# Patient Record
Sex: Female | Born: 1965 | Race: White | Hispanic: No | Smoking: Never smoker
Health system: Southern US, Community
[De-identification: ages and names within clinical notes are randomized; demographics above are authoritative.]

## PROBLEM LIST (undated history)

## (undated) DIAGNOSIS — M1990 Unspecified osteoarthritis, unspecified site: Secondary | ICD-10-CM

## (undated) DIAGNOSIS — R519 Headache, unspecified: Secondary | ICD-10-CM

## (undated) DIAGNOSIS — I639 Cerebral infarction, unspecified: Secondary | ICD-10-CM

## (undated) DIAGNOSIS — R51 Headache: Secondary | ICD-10-CM

## (undated) DIAGNOSIS — M797 Fibromyalgia: Secondary | ICD-10-CM

## (undated) DIAGNOSIS — I10 Essential (primary) hypertension: Secondary | ICD-10-CM

## (undated) DIAGNOSIS — R609 Edema, unspecified: Secondary | ICD-10-CM

## (undated) DIAGNOSIS — E785 Hyperlipidemia, unspecified: Secondary | ICD-10-CM

## (undated) HISTORY — DX: Edema, unspecified: R60.9

## (undated) HISTORY — DX: Headache: R51

## (undated) HISTORY — DX: Hyperlipidemia, unspecified: E78.5

## (undated) HISTORY — DX: Headache, unspecified: R51.9

## (undated) HISTORY — PX: ABDOMINAL HYSTERECTOMY: SHX81

## (undated) HISTORY — DX: Fibromyalgia: M79.7

## (undated) HISTORY — DX: Cerebral infarction, unspecified: I63.9

## (undated) HISTORY — DX: Essential (primary) hypertension: I10

---

## 2009-04-25 ENCOUNTER — Ambulatory Visit (HOSPITAL_COMMUNITY): Admission: RE | Admit: 2009-04-25 | Discharge: 2009-04-25 | Payer: Self-pay

## 2016-05-30 ENCOUNTER — Ambulatory Visit (INDEPENDENT_AMBULATORY_CARE_PROVIDER_SITE_OTHER): Payer: BLUE CROSS/BLUE SHIELD | Admitting: Neurology

## 2016-05-30 ENCOUNTER — Encounter: Payer: Self-pay | Admitting: Neurology

## 2016-05-30 VITALS — BP 112/70 | HR 76 | Resp 16 | Ht 63.0 in | Wt 200.0 lb

## 2016-05-30 DIAGNOSIS — R0683 Snoring: Secondary | ICD-10-CM

## 2016-05-30 DIAGNOSIS — G2581 Restless legs syndrome: Secondary | ICD-10-CM | POA: Diagnosis not present

## 2016-05-30 DIAGNOSIS — G478 Other sleep disorders: Secondary | ICD-10-CM

## 2016-05-30 DIAGNOSIS — E669 Obesity, unspecified: Secondary | ICD-10-CM | POA: Diagnosis not present

## 2016-05-30 DIAGNOSIS — R400 Somnolence: Secondary | ICD-10-CM | POA: Diagnosis not present

## 2016-05-30 NOTE — Progress Notes (Signed)
Subjective:    Patient ID: Deanna Franco is a 50 y.o. female.  HPI     Huston FoleySaima Athar, MD, PhD [facility] [address], [address] [address] [place], [place] [address]  Dear Dr. Clelia CroftShaw,   I saw your patient, Deanna ChiquitoChristine Franco, upon your kind request in my neurologic clinic today for initial consultation of her sleep disorder, in particular, concern for underlying obstructive sleep apnea. The patient is unaccompanied today. As you know, Ms. Deanna HaileyDustin is a 50 year old right-handed woman with an underlying medical history of hypertension, edema, hyperlipidemia, migraine headaches, anemia, and obesity, who reports snoring and excessive daytime somnolence. I reviewed your office note from 04/24/2016 which you kindly included. She has been on blood pressure medication and weight loss medication. Her Epworth sleepiness score is 13 out of 24 today, her fatigue score is 38 out of 63.She does not wake up rested.  Snoring can be loud and this is corroborated by her own children and her current BF.  She has occasional RLS symptoms, unsure if she kicks in her sleep. She tries to be in bed around 10 PM, she does not watch TV in bed. Wakeup time is 5:30 AM. She has no significant difficulty falling asleep or staying asleep but sleep can be disrupted by their 2 cats. She has 2 grown children, daughter is [id number] and son is 1924. She lives with her boyfriend and his 2 teenage daughters. She denies nocturia on a night to night basis and denies any significant morning headaches. A couple of years ago she had more difficulty with blood pressure management. Blood pressure is currently under control and she only takes hydrochlorothiazide. Her weight loss medication helps her lose weight to some degree but primarily maintain weight. When she has come off of her memory loss medication she often tends to gain weight.  Her Past Medical History Is Significant For: Past Medical History:  Diagnosis Date  .  Dyslipidemia   . Edema   . Fibromyalgia    ? not confirmed   . Headache   . Hypertension   . Stroke [facility](HCC)    not confirmed, possible in 2009    Her Past Surgical History Is Significant For: Past Surgical History:  Procedure Laterality Date  . ABDOMINAL HYSTERECTOMY    . CESAREAN SECTION      Her Family History Is Significant For: Family History  Problem Relation Age of Onset  . Lung cancer Mother     Her Social History Is Significant For: Social History   Social History  . Marital status: Significant Other    Spouse name: N/A  . Number of children: 2  . Years of education: HS   Social History Main Topics  . Smoking status: Never Smoker  . Smokeless tobacco: Never Used  . Alcohol use No  . Drug use: No  . Sexual activity: Not Asked   Other Topics Concern  . None   Social History Narrative   Rare caffeine use     Her Allergies Are:  Allergies  Allergen Reactions  . Neomycin   . Penicillins   . Sulfa Antibiotics   :   Her Current Medications Are:  Outpatient Encounter Prescriptions as of 05/30/2016  Medication Sig  . hydrochlorothiazide (HYDRODIURIL) 25 MG tablet Take 25 mg by mouth daily.  Marland Kitchen. QSYMIA 11.25-69 MG CP24 Take 1 capsule by mouth daily.   No facility-administered encounter medications on file as of 05/30/2016.   :  Review of Systems:  Out of a complete 14 point review of systems, all are reviewed and negative with the exception of these symptoms as listed below:  Review of Systems  Neurological:       No trouble falling and staying asleep, snoring, wakes up feeling tired, daytime fatigue, denies taking naps.    Epworth Sleepiness Scale 0= would never doze 1= slight chance of dozing 2= moderate chance of dozing 3= high chance of dozing  Sitting and reading:3 Watching TV:3 Sitting inactive in a public place (ex. Theater or meeting):0 As a passenger in a car for an hour without a break:3 Lying down to rest in the afternoon:3 Sitting  and talking to someone:0 Sitting quietly after lunch (no alcohol):1 In a car, while stopped in traffic:0 Total:13   Objective:  Neurologic Exam  Physical Exam Physical Examination:   Vitals:   05/30/16 1006  BP: 112/70  Pulse: 76  Resp: 16   General Examination: The patient is a very pleasant 50 y.o. female in no acute distress. She appears well-developed and well-nourished and well groomed.   HEENT: Normocephalic, atraumatic, pupils are equal, round and reactive to light and accommodation. Funduscopic exam is normal with sharp disc margins noted. Extraocular tracking is good without limitation to gaze excursion or nystagmus noted. Normal smooth pursuit is noted. Hearing is grossly intact. Tympanic membranes are clear bilaterally. Face is symmetric with normal facial animation and normal facial sensation. Speech is clear with no dysarthria noted. There is no hypophonia. There is no lip, neck/head, jaw or voice tremor. Neck is supple with full range of passive and active motion. There are no carotid bruits on auscultation. Oropharynx exam reveals: mild mouth dryness, good dental hygiene and mild airway crowding, due to smaller airway entry and lower reaching uvula and soft palate. Mallampati is class II. Tongue protrudes centrally and palate elevates symmetrically. Tonsils are absent. Neck size is 16 inches. She has a Mild overbite. Nasal inspection reveals no significant nasal mucosal bogginess or redness and no septal deviation.   Chest: Clear to auscultation without wheezing, rhonchi or crackles noted.  Heart: S1+S2+0, regular and normal without murmurs, rubs or gallops noted.   Abdomen: Soft, non-tender and non-distended with normal bowel sounds appreciated on auscultation.  Extremities: There is no pitting edema in the distal lower extremities bilaterally. Pedal pulses are intact.  Skin: Warm and dry without trophic changes noted. There are no varicose veins in the distal  legs.  Musculoskeletal: exam reveals no obvious joint deformities, tenderness or joint swelling or erythema.   Neurologically:  Mental status: The patient is awake, alert and oriented in all 4 spheres. Her immediate and remote memory, attention, language skills and fund of knowledge are appropriate. There is no evidence of aphasia, agnosia, apraxia or anomia. Speech is clear with normal prosody and enunciation. Thought process is linear. Mood is normal and affect is normal.  Cranial nerves II - XII are as described above under HEENT exam. In addition: shoulder shrug is normal with equal shoulder height noted. Motor exam: Normal bulk, strength and tone is noted. There is no drift, tremor or rebound. Romberg is negative. Reflexes are 1+ throughout. Fine motor skills and coordination: intact with normal finger taps, normal hand movements, normal rapid alternating patting, normal foot taps and normal foot agility.  Cerebellar testing: No dysmetria or intention tremor on finger to nose testing. Heel to shin is unremarkable bilaterally. There is no truncal or gait ataxia.  Sensory exam: intact to light touch, pinprick, vibration, temperature  sense in the upper and lower extremities.  Gait, station and balance: She stands easily. No veering to one side is noted. No leaning to one side is noted. Posture is age-appropriate and stance is narrow based. Gait shows normal stride length and normal pace. No problems turning are noted. Tandem walk is unremarkable.   Assessment and Plan:  In summary, BREANE GRUNWALD is a very pleasant 50 y.o.-year old female with an underlying medical history of hypertension, edema, hyperlipidemia, migraine headaches, anemia, and obesity, whose history and physical exam concerning for obstructive sleep apnea (OSA). In addition, she endorses intermittent restless leg symptoms. I had a long chat with the patient about my findings and the diagnosis of OSA, its prognosis and treatment  options. We talked about medical treatments, surgical interventions and non-pharmacological approaches. I explained in particular the risks and ramifications of untreated moderate to severe OSA, especially with respect to developing cardiovascular disease down the Road, including congestive heart failure, difficult to treat hypertension, cardiac arrhythmias, or stroke. Even type 2 diabetes has, in part, been linked to untreated OSA. Symptoms of untreated OSA include daytime sleepiness, memory problems, mood irritability and mood disorder such as depression and anxiety, lack of energy, as well as recurrent headaches, especially morning headaches. We talked about trying to maintain a healthy lifestyle in general, as well as the importance of weight control. I encouraged the patient to eat healthy, exercise daily and keep well hydrated, to keep a scheduled bedtime and wake time routine, to not skip any meals and eat healthy snacks in between meals. I advised the patient not to drive when feeling sleepy. I recommended the following at this time: sleep study with potential positive airway pressure titration. (We will score hypopneas at 3% and split the sleep study into diagnostic and treatment portion, if the estimated. 2 hour AHI is >15/h).  We will also be on the lookout for PLMs during her PSG.  I explained the sleep test procedure to the patient and also outlined possible surgical and non-surgical treatment options of OSA, including the use of a custom-made dental device (which would require a referral to a specialist dentist or oral surgeon), upper airway surgical options, such as pillar implants, radiofrequency surgery, tongue base surgery, and UPPP (which would involve a referral to an ENT surgeon). Rarely, jaw surgery such as mandibular advancement may be considered.  I also explained the CPAP treatment option to the patient, who indicated that she would be willing to try CPAP if the need arises. I explained  the importance of being compliant with PAP treatment, not only for insurance purposes but primarily to improve Her symptoms, and for the patient's long term health benefit, including to reduce Her cardiovascular risks. I answered all her questions today and the patient was in agreement. I would like to see her back after the sleep study is completed and encouraged her to call with any interim questions, concerns, problems or updates.   Thank you very much for allowing me to participate in the care of this nice patient. If I can be of any further assistance to you please do not hesitate to call me at [phone].  Sincerely,   Huston Foley, MD, PhD

## 2016-05-30 NOTE — Patient Instructions (Signed)

## 2016-09-04 ENCOUNTER — Other Ambulatory Visit: Payer: Self-pay | Admitting: Obstetrics and Gynecology

## 2016-09-04 DIAGNOSIS — R928 Other abnormal and inconclusive findings on diagnostic imaging of breast: Secondary | ICD-10-CM

## 2016-09-10 ENCOUNTER — Encounter: Payer: Self-pay | Admitting: Radiology

## 2016-09-10 ENCOUNTER — Ambulatory Visit
Admission: RE | Admit: 2016-09-10 | Discharge: 2016-09-10 | Disposition: A | Discharge status: Home / Self Care | Payer: No Typology Code available for payment source | Source: Ambulatory Visit | Attending: Obstetrics and Gynecology | Admitting: Obstetrics and Gynecology

## 2016-09-10 DIAGNOSIS — R928 Other abnormal and inconclusive findings on diagnostic imaging of breast: Secondary | ICD-10-CM

## 2016-10-09 ENCOUNTER — Telehealth: Payer: Self-pay | Admitting: Neurology

## 2017-01-07 NOTE — Telephone Encounter (Signed)
error 

## 2018-12-29 ENCOUNTER — Other Ambulatory Visit: Payer: Self-pay | Admitting: Orthopedic Surgery

## 2018-12-29 DIAGNOSIS — M25462 Effusion, left knee: Secondary | ICD-10-CM

## 2018-12-29 DIAGNOSIS — M25562 Pain in left knee: Secondary | ICD-10-CM

## 2019-01-13 ENCOUNTER — Other Ambulatory Visit: Payer: Self-pay

## 2019-01-13 ENCOUNTER — Ambulatory Visit
Admission: RE | Admit: 2019-01-13 | Discharge: 2019-01-13 | Disposition: A | Discharge status: Home / Self Care | Payer: No Typology Code available for payment source | Source: Ambulatory Visit | Attending: Orthopedic Surgery | Admitting: Orthopedic Surgery

## 2019-01-13 DIAGNOSIS — M25562 Pain in left knee: Secondary | ICD-10-CM | POA: Diagnosis present

## 2019-01-13 DIAGNOSIS — M25462 Effusion, left knee: Secondary | ICD-10-CM | POA: Diagnosis present

## 2019-03-06 ENCOUNTER — Encounter
Admission: RE | Admit: 2019-03-06 | Discharge: 2019-03-06 | Disposition: A | Discharge status: Home / Self Care | Payer: No Typology Code available for payment source | Source: Ambulatory Visit | Attending: Surgery | Admitting: Surgery

## 2019-03-06 ENCOUNTER — Other Ambulatory Visit: Payer: Self-pay

## 2019-03-06 DIAGNOSIS — Z01812 Encounter for preprocedural laboratory examination: Secondary | ICD-10-CM | POA: Diagnosis present

## 2019-03-06 HISTORY — DX: Unspecified osteoarthritis, unspecified site: M19.90

## 2019-03-06 NOTE — Patient Instructions (Signed)
Your procedure is scheduled on: 03-12-19 THURSDAY Report to Same Day Surgery 2nd floor medical mall [facility](Medical Mall Entrance-take elevator on left to 2nd floor.  Check in with surgery information desk.) To find out your arrival time please call [phone] between 1PM - 3PM [facility]  Remember: Instructions that are not followed completely may result in serious medical risk, up to and including death, or upon the discretion of your surgeon and anesthesiologist your surgery may need to be rescheduled.    _x___ 1. Do not eat food after midnight the night before your procedure. NO GUM OR CANDY AFTER MIDNIGHT. You may drink clear liquids up to 2 hours before you are scheduled to arrive at the hospital for your procedure.  Do not drink clear liquids within 2 hours of your scheduled arrival to the hospital.  Clear liquids include  --Water or Apple juice without pulp  --Gatorade  --Black Coffee or Clear Tea (No milk, no creamers, do not add anything to the coffee or Tea   ____Ensure clear carbohydrate drink on the way to the hospital for bariatric patients  ____Ensure clear carbohydrate drink 3 hours before surgery.    __x__ 2. No Alcohol for 24 hours before or after surgery.   __x__3. No Smoking or e-cigarettes for 24 prior to surgery.  Do not use any chewable tobacco products for at least 6 hour prior to surgery   ____  4. Bring all medications with you on the day of surgery if instructed.    __x__ 5. Notify your doctor if there is any change in your medical condition     (cold, fever, infections).    x___6. On the morning of surgery brush your teeth with toothpaste and water.  You may rinse your mouth with mouth wash if you wish.  Do not swallow any toothpaste or mouthwash.   Do not wear jewelry, make-up, hairpins, clips or nail polish.  Do not wear lotions, powders, or perfumes.   Do not shave 48 hours prior to surgery. Men may shave face and neck.  Do not bring valuables to  the hospital.    [facility] is not responsible for any belongings or valuables.               Contacts, dentures or bridgework may not be worn into surgery.  Leave your suitcase in the car. After surgery it may be brought to your room.  For patients admitted to the hospital, discharge time is determined by your treatment team.  _  Patients discharged the day of surgery will not be allowed to drive home.  You will need someone to drive you home and stay with you the night of your procedure.    Please read over the following fact sheets that you were given:   [facility] Preparing for Surgery and or MRSA Information   ____ Take anti-hypertensive listed below, cardiac, seizure, asthma, anti-reflux and psychiatric medicines. These include:  1. NONE  2.  3.  4.  5.  6.  ____Fleets enema or Magnesium Citrate as directed.   _x___ Use CHG Soap or sage wipes as directed on instruction sheet   ____ Use inhalers on the day of surgery and bring to hospital day of surgery  ____ Stop Metformin and Janumet 2 days prior to surgery.    ____ Take 1/2 of usual insulin dose the night before surgery and none on the morning surgery.   ____ Follow recommendations from Cardiologist, Pulmonologist or PCP regarding  stopping Aspirin, Coumadin, Plavix ,Eliquis, Effient, or Pradaxa, and Pletal.  X____Stop Anti-inflammatories such as Advil, Aleve, Ibuprofen, Motrin, Naproxen, Naprosyn, Goodies powders or aspirin products  NOW-OK to take Tylenol    _x___ Stop supplements until after surgery-STOP ASTAXANTHIN, QSYMIA AND RHUBARB(ESTROVEN MENOPAUSE RELIEF)   ____ Bring C-Pap to the hospital.

## 2019-03-10 ENCOUNTER — Other Ambulatory Visit
Admission: RE | Admit: 2019-03-10 | Discharge: 2019-03-10 | Disposition: A | Discharge status: Home / Self Care | Payer: No Typology Code available for payment source | Source: Ambulatory Visit | Attending: Surgery | Admitting: Surgery

## 2019-03-10 DIAGNOSIS — Z01812 Encounter for preprocedural laboratory examination: Secondary | ICD-10-CM | POA: Diagnosis present

## 2019-03-10 DIAGNOSIS — Z20828 Contact with and (suspected) exposure to other viral communicable diseases: Secondary | ICD-10-CM | POA: Insufficient documentation

## 2019-03-10 DIAGNOSIS — S83242A Other tear of medial meniscus, current injury, left knee, initial encounter: Secondary | ICD-10-CM | POA: Diagnosis not present

## 2019-03-10 DIAGNOSIS — S83262A Peripheral tear of lateral meniscus, current injury, left knee, initial encounter: Secondary | ICD-10-CM | POA: Diagnosis not present

## 2019-03-10 LAB — SARS CORONAVIRUS 2 (TAT 6-24 HRS): SARS Coronavirus 2: NEGATIVE

## 2019-03-10 LAB — POTASSIUM: Potassium: 3.9 mmol/L (ref 3.5–5.1)

## 2019-03-12 ENCOUNTER — Ambulatory Visit: Payer: No Typology Code available for payment source | Admitting: Anesthesiology

## 2019-03-12 ENCOUNTER — Ambulatory Visit
Admission: RE | Admit: 2019-03-12 | Discharge: 2019-03-12 | Disposition: A | Discharge status: Home / Self Care | Payer: No Typology Code available for payment source | Attending: Surgery | Admitting: Surgery

## 2019-03-12 ENCOUNTER — Encounter
Admission: RE | Disposition: A | Discharge status: Home / Self Care | Payer: Self-pay | Source: Home / Self Care | Attending: Surgery

## 2019-03-12 ENCOUNTER — Other Ambulatory Visit: Payer: Self-pay

## 2019-03-12 ENCOUNTER — Encounter: Payer: Self-pay | Admitting: *Deleted

## 2019-03-12 DIAGNOSIS — Z90710 Acquired absence of both cervix and uterus: Secondary | ICD-10-CM | POA: Insufficient documentation

## 2019-03-12 DIAGNOSIS — X58XXXA Exposure to other specified factors, initial encounter: Secondary | ICD-10-CM | POA: Insufficient documentation

## 2019-03-12 DIAGNOSIS — E785 Hyperlipidemia, unspecified: Secondary | ICD-10-CM | POA: Insufficient documentation

## 2019-03-12 DIAGNOSIS — Z79899 Other long term (current) drug therapy: Secondary | ICD-10-CM | POA: Insufficient documentation

## 2019-03-12 DIAGNOSIS — Z881 Allergy status to other antibiotic agents status: Secondary | ICD-10-CM | POA: Diagnosis not present

## 2019-03-12 DIAGNOSIS — M6752 Plica syndrome, left knee: Secondary | ICD-10-CM | POA: Diagnosis not present

## 2019-03-12 DIAGNOSIS — Y9389 Activity, other specified: Secondary | ICD-10-CM | POA: Insufficient documentation

## 2019-03-12 DIAGNOSIS — Z882 Allergy status to sulfonamides status: Secondary | ICD-10-CM | POA: Insufficient documentation

## 2019-03-12 DIAGNOSIS — Z880 Allergy status to penicillin: Secondary | ICD-10-CM | POA: Insufficient documentation

## 2019-03-12 DIAGNOSIS — Z801 Family history of malignant neoplasm of trachea, bronchus and lung: Secondary | ICD-10-CM | POA: Insufficient documentation

## 2019-03-12 DIAGNOSIS — S83282A Other tear of lateral meniscus, current injury, left knee, initial encounter: Secondary | ICD-10-CM | POA: Diagnosis not present

## 2019-03-12 DIAGNOSIS — I10 Essential (primary) hypertension: Secondary | ICD-10-CM | POA: Diagnosis not present

## 2019-03-12 DIAGNOSIS — G709 Myoneural disorder, unspecified: Secondary | ICD-10-CM | POA: Insufficient documentation

## 2019-03-12 DIAGNOSIS — M1712 Unilateral primary osteoarthritis, left knee: Secondary | ICD-10-CM | POA: Insufficient documentation

## 2019-03-12 DIAGNOSIS — S83232A Complex tear of medial meniscus, current injury, left knee, initial encounter: Secondary | ICD-10-CM | POA: Diagnosis not present

## 2019-03-12 DIAGNOSIS — R51 Headache: Secondary | ICD-10-CM | POA: Diagnosis not present

## 2019-03-12 DIAGNOSIS — K219 Gastro-esophageal reflux disease without esophagitis: Secondary | ICD-10-CM | POA: Diagnosis not present

## 2019-03-12 HISTORY — PX: KNEE ARTHROSCOPY WITH LATERAL MENISECTOMY: SHX6193

## 2019-03-12 SURGERY — ARTHROSCOPY, KNEE, WITH LATERAL MENISCECTOMY
Anesthesia: General | Site: Knee | Laterality: Left

## 2019-03-12 MED ORDER — LIDOCAINE HCL (CARDIAC) PF 100 MG/5ML IV SOSY
PREFILLED_SYRINGE | INTRAVENOUS | Status: DC | PRN
  Administered 2019-03-12: 80 mg via INTRAVENOUS

## 2019-03-12 MED ORDER — FENTANYL CITRATE (PF) 100 MCG/2ML IJ SOLN
INTRAMUSCULAR | Status: DC | PRN
  Administered 2019-03-12 (×3): 25 ug via INTRAVENOUS
  Administered 2019-03-12: 50 ug via INTRAVENOUS

## 2019-03-12 MED ORDER — FAMOTIDINE 20 MG PO TABS
20.0000 mg | ORAL_TABLET | Freq: Once | ORAL | Status: AC
  Administered 2019-03-12: 08:00:00 20 mg via ORAL

## 2019-03-12 MED ORDER — OXYCODONE HCL 5 MG/5ML PO SOLN: 5.0000 mg | Freq: Once | ORAL | Status: DC | PRN

## 2019-03-12 MED ORDER — ONDANSETRON HCL 4 MG/2ML IJ SOLN
INTRAMUSCULAR | Status: DC | PRN
  Administered 2019-03-12: 4 mg via INTRAVENOUS

## 2019-03-12 MED ORDER — MIDAZOLAM HCL 2 MG/2ML IJ SOLN
INTRAMUSCULAR | Status: DC | PRN
  Administered 2019-03-12: 2 mg via INTRAVENOUS

## 2019-03-12 MED ORDER — OXYCODONE HCL 5 MG PO TABS: 5.0000 mg | ORAL_TABLET | Freq: Once | ORAL | Status: DC | PRN

## 2019-03-12 MED ORDER — FAMOTIDINE 20 MG PO TABS
ORAL_TABLET | ORAL | Status: AC
  Administered 2019-03-12: 20 mg via ORAL
  Filled 2019-03-12: qty 1

## 2019-03-12 MED ORDER — ONDANSETRON HCL 4 MG/2ML IJ SOLN
4.0000 mg | Freq: Once | INTRAMUSCULAR | Status: AC
  Administered 2019-03-12: 4 mg via INTRAVENOUS

## 2019-03-12 MED ORDER — HYDROCODONE-ACETAMINOPHEN 5-325 MG PO TABS: 1.0000 | ORAL_TABLET | Freq: Four times a day (QID) | ORAL | 0 refills | Status: DC | PRN

## 2019-03-12 MED ORDER — PROPOFOL 10 MG/ML IV BOLUS
INTRAVENOUS | Status: DC | PRN
  Administered 2019-03-12: 160 mg via INTRAVENOUS

## 2019-03-12 MED ORDER — DEXAMETHASONE SODIUM PHOSPHATE 10 MG/ML IJ SOLN
INTRAMUSCULAR | Status: DC | PRN
  Administered 2019-03-12: 5 mg via INTRAVENOUS

## 2019-03-12 MED ORDER — CLINDAMYCIN PHOSPHATE 900 MG/50ML IV SOLN
900.0000 mg | Freq: Once | INTRAVENOUS | Status: AC
  Administered 2019-03-12: 900 mg via INTRAVENOUS

## 2019-03-12 MED ORDER — FENTANYL CITRATE (PF) 100 MCG/2ML IJ SOLN
INTRAMUSCULAR | Status: AC
  Filled 2019-03-12: qty 2

## 2019-03-12 MED ORDER — CLINDAMYCIN PHOSPHATE 900 MG/50ML IV SOLN
INTRAVENOUS | Status: AC
  Filled 2019-03-12: qty 50

## 2019-03-12 MED ORDER — LIDOCAINE HCL (PF) 1 % IJ SOLN
INTRAMUSCULAR | Status: AC
  Filled 2019-03-12: qty 30

## 2019-03-12 MED ORDER — LACTATED RINGERS IV SOLN
INTRAVENOUS | Status: DC
  Administered 2019-03-12: 08:00:00 via INTRAVENOUS

## 2019-03-12 MED ORDER — BUPIVACAINE-EPINEPHRINE (PF) 0.5% -1:200000 IJ SOLN
INTRAMUSCULAR | Status: DC | PRN
  Administered 2019-03-12 (×2): 30 mL

## 2019-03-12 MED ORDER — ONDANSETRON HCL 4 MG/2ML IJ SOLN
INTRAMUSCULAR | Status: AC
  Filled 2019-03-12: qty 2

## 2019-03-12 MED ORDER — MIDAZOLAM HCL 2 MG/2ML IJ SOLN
INTRAMUSCULAR | Status: AC
  Filled 2019-03-12: qty 2

## 2019-03-12 MED ORDER — LIDOCAINE HCL 1 % IJ SOLN
INTRAMUSCULAR | Status: DC | PRN
  Administered 2019-03-12: 30 mL

## 2019-03-12 MED ORDER — FENTANYL CITRATE (PF) 100 MCG/2ML IJ SOLN: 25.0000 ug | INTRAMUSCULAR | Status: DC | PRN

## 2019-03-12 MED ORDER — SUGAMMADEX SODIUM 200 MG/2ML IV SOLN
INTRAVENOUS | Status: AC
  Filled 2019-03-12: qty 4

## 2019-03-12 MED ORDER — BUPIVACAINE-EPINEPHRINE (PF) 0.5% -1:200000 IJ SOLN
INTRAMUSCULAR | Status: AC
  Filled 2019-03-12: qty 60

## 2019-03-12 SURGICAL SUPPLY — 44 items
APL PRP STRL LF DISP 70% ISPRP (MISCELLANEOUS) ×1
BAG COUNTER SPONGE EZ (MISCELLANEOUS) IMPLANT
BAG SPNG 4X4 CLR HAZ (MISCELLANEOUS)
BLADE FULL RADIUS 3.5 (BLADE) ×2 IMPLANT
BLADE SHAVER 4.5X7 STR FR (MISCELLANEOUS) ×2 IMPLANT
BNDG ELASTIC 6X5.8 VLCR STR LF (GAUZE/BANDAGES/DRESSINGS) ×2 IMPLANT
CHLORAPREP W/TINT 26 (MISCELLANEOUS) ×2 IMPLANT
COVER WAND RF STERILE (DRAPES) ×2 IMPLANT
CUFF TOURN SGL QUICK 24 (TOURNIQUET CUFF)
CUFF TOURN SGL QUICK 30 (TOURNIQUET CUFF)
CUFF TOURN SGL QUICK 34 (TOURNIQUET CUFF) ×2
CUFF TRNQT CYL 24X4X16.5-23 (TOURNIQUET CUFF) IMPLANT
CUFF TRNQT CYL 30X4X21-28X (TOURNIQUET CUFF) IMPLANT
CUFF TRNQT CYL 34X4.125X (TOURNIQUET CUFF) IMPLANT
DRAPE IMP U-DRAPE 54X76 (DRAPES) ×1 IMPLANT
DRAPE SPLIT 6X30 W/TAPE (DRAPES) ×2 IMPLANT
ELECT REM PT RETURN 9FT ADLT (ELECTROSURGICAL) ×2
ELECTRODE REM PT RTRN 9FT ADLT (ELECTROSURGICAL) ×1 IMPLANT
FASTFIX NDL DEL SYS 360 CVD (Miscellaneous) ×6 IMPLANT
GAUZE SPONGE 4X4 12PLY STRL (GAUZE/BANDAGES/DRESSINGS) ×2 IMPLANT
GLOVE BIO SURGEON STRL SZ8 (GLOVE) ×4 IMPLANT
GLOVE BIOGEL M 7.0 STRL (GLOVE) ×4 IMPLANT
GLOVE BIOGEL PI IND STRL 7.5 (GLOVE) ×1 IMPLANT
GLOVE BIOGEL PI INDICATOR 7.5 (GLOVE) ×1
GLOVE INDICATOR 8.0 STRL GRN (GLOVE) ×2 IMPLANT
GOWN STRL REUS W/ TWL LRG LVL3 (GOWN DISPOSABLE) ×1 IMPLANT
GOWN STRL REUS W/ TWL XL LVL3 (GOWN DISPOSABLE) ×2 IMPLANT
GOWN STRL REUS W/TWL LRG LVL3 (GOWN DISPOSABLE) ×2
GOWN STRL REUS W/TWL XL LVL3 (GOWN DISPOSABLE) ×4
IV LACTATED RINGER IRRG 3000ML (IV SOLUTION) ×2
IV LR IRRIG 3000ML ARTHROMATIC (IV SOLUTION) ×1 IMPLANT
KIT TURNOVER KIT A (KITS) ×2 IMPLANT
MANIFOLD NEPTUNE II (INSTRUMENTS) ×2 IMPLANT
NDL HYPO 21X1.5 SAFETY (NEEDLE) ×1 IMPLANT
NEEDLE HYPO 21X1.5 SAFETY (NEEDLE) ×2 IMPLANT
PACK ARTHROSCOPY KNEE (MISCELLANEOUS) ×2 IMPLANT
PENCIL ELECTRO HAND CTR (MISCELLANEOUS) ×2 IMPLANT
PUSHER KNOT ARTHRO STRT FASTFI (MISCELLANEOUS) ×1 IMPLANT
SUT PROLENE 4 0 PS 2 18 (SUTURE) ×2 IMPLANT
SUT TICRON COATED BLUE 2 0 30 (SUTURE) IMPLANT
SYR 50ML LL SCALE MARK (SYRINGE) ×2 IMPLANT
SYSTEM NDL DEL FSTFX  360 CVD (Miscellaneous) IMPLANT
TUBING ARTHRO INFLOW-ONLY STRL (TUBING) ×2 IMPLANT
WAND WEREWOLF FLOW 90D (MISCELLANEOUS) ×2 IMPLANT

## 2019-03-12 NOTE — Anesthesia Post-op Follow-up Note (Signed)
Anesthesia QCDR form completed.        

## 2019-03-12 NOTE — Discharge Instructions (Addendum)
Orthopedic discharge instructions: Keep dressing dry and intact.  May shower after dressing changed on post-op day #4 (Monday).  Cover sutures with Band-Aids after drying off. Apply ice frequently to knee. Take ibuprofen 800 mg TID with meals for 7-10 days, then as necessary. Take pain medication as prescribed or ES Tylenol when needed.  May weight-bear as tolerated - use crutches or walker as needed. Follow-up in 10-14 days or as scheduled.  AMBULATORY SURGERY  DISCHARGE INSTRUCTIONS   1) The drugs that you were given will stay in your system until tomorrow so for the next 24 hours you should not:  A) Drive an automobile B) Make any legal decisions C) Drink any alcoholic beverage   2) You may resume regular meals tomorrow.  Today it is better to start with liquids and gradually work up to solid foods.  You may eat anything you prefer, but it is better to start with liquids, then soup and crackers, and gradually work up to solid foods.   3) Please notify your doctor immediately if you have any unusual bleeding, trouble breathing, redness and pain at the surgery site, drainage, fever, or pain not relieved by medication.    4) Additional Instructions:        Please contact your physician with any problems or Same Day Surgery at [phone], Monday through Friday 6 am to 4 pm, or [place] at [facility] number at [phone].

## 2019-03-12 NOTE — Anesthesia Procedure Notes (Signed)
Procedure Name: Intubation Performed by: Fredderick Phenix, CRNA Pre-anesthesia Checklist: Patient identified, Emergency Drugs available, Suction available and Patient being monitored Patient Re-evaluated:Patient Re-evaluated prior to induction Oxygen Delivery Method: Circle system utilized Preoxygenation: Pre-oxygenation with 100% oxygen Induction Type: IV induction Ventilation: Mask ventilation without difficulty LMA: LMA inserted LMA Size: 3.5 Number of attempts: 1 Airway Equipment and Method: Oral airway Placement Confirmation: breath sounds checked- equal and bilateral and positive ETCO2 Tube secured with: Tape Dental Injury: Teeth and Oropharynx as per pre-operative assessment

## 2019-03-12 NOTE — H&P (Signed)
Paper H&P to be scanned into permanent record. H&P reviewed and patient re-examined. No changes. 

## 2019-03-12 NOTE — Anesthesia Preprocedure Evaluation (Signed)
Anesthesia Evaluation  Patient identified by MRN, date of birth, ID band Patient awake    Reviewed: Allergy & Precautions, H&P , NPO status , Patient's Chart, lab work & pertinent test results  History of Anesthesia Complications Negative for: history of anesthetic complications  Airway Mallampati: III  TM Distance: <3 FB Neck ROM: full    Dental  (+) Chipped   Pulmonary neg pulmonary ROS, neg shortness of breath,           Cardiovascular Exercise Tolerance: Good hypertension, (-) angina(-) Past MI and (-) DOE      Neuro/Psych  Headaches,  Neuromuscular disease CVA negative psych ROS   GI/Hepatic Neg liver ROS, GERD  Medicated and Controlled,  Endo/Other  negative endocrine ROS  Renal/GU      Musculoskeletal   Abdominal   Peds  Hematology negative hematology ROS (+)   Anesthesia Other Findings Past Medical History: No date: Arthritis No date: Dyslipidemia No date: Edema No date: Fibromyalgia     Comment:  ? not confirmed  No date: Headache No date: Hypertension     Comment:  H/O JOB RELATED No date: Stroke [facility])     Comment:  not confirmed, possible in 2009  Past Surgical History: No date: ABDOMINAL HYSTERECTOMY No date: CESAREAN SECTION  BMI    Body Mass Index: 39.69 kg/m      Reproductive/Obstetrics negative OB ROS                             Anesthesia Physical Anesthesia Plan  ASA: III  Anesthesia Plan: General LMA   Post-op Pain Management:    Induction: Intravenous  PONV Risk Score and Plan: Dexamethasone, Ondansetron, Midazolam and Treatment may vary due to age or medical condition  Airway Management Planned: LMA  Additional Equipment:   Intra-op Plan:   Post-operative Plan: Extubation in OR  Informed Consent: I have reviewed the patients History and Physical, chart, labs and discussed the procedure including the risks, benefits and alternatives for  the proposed anesthesia with the patient or authorized representative who has indicated his/her understanding and acceptance.     Dental Advisory Given  Plan Discussed with: Anesthesiologist, CRNA and Surgeon  Anesthesia Plan Comments: (Patient consented for risks of anesthesia including but not limited to:  - adverse reactions to medications - damage to teeth, lips or other oral mucosa - sore throat or hoarseness - Damage to heart, brain, lungs or loss of life  Patient voiced understanding.)        Anesthesia Quick Evaluation

## 2019-03-12 NOTE — Transfer of Care (Signed)
Immediate Anesthesia Transfer of Care Note  Patient: Deanna Franco  Procedure(s) Performed: KNEE ARTHROSCOPY WITH DEBRIDEMENT AND REPAIR VERSUS PARTIAL MEDIAL AND LATERAL MENISECTOMY (Left Knee)  Patient Location: PACU  Anesthesia Type:General  Level of Consciousness: awake and alert   Airway & Oxygen Therapy: Patient Spontanous Breathing and Patient connected to face mask oxygen  Post-op Assessment: Report given to RN and Post -op Vital signs reviewed and stable  Post vital signs: Reviewed and stable  Last Vitals:  Vitals Value Taken Time  BP 125/76 03/12/19 1130  Temp 36.5 C 03/12/19 1130  Pulse 81 03/12/19 1132  Resp 14 03/12/19 1132  SpO2 96 % 03/12/19 1132  Vitals shown include unvalidated device data.  Last Pain:  Vitals:   03/12/19 0814  TempSrc: Tympanic  PainSc: 0-No pain         Complications: No apparent anesthesia complications

## 2019-03-12 NOTE — Op Note (Signed)
03/12/2019  11:39 AM  Patient:   Deanna Franco  Pre-Op Diagnosis:   Medial and lateral meniscal tears, left knee.  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  Procedure:   Arthroscopic medial meniscus repair, arthroscopic partial lateral meniscectomy, abrasion chondroplasty of grade 2 chondromalacial changes of femoral trochlea, and debridement of symptomatic suprapatellar plica, left knee.  Surgeon:   Pascal Lux, MD  Assistant:   Orland Penman, PA-S  Anesthesia:   General LMA  Findings:   As above. There was an unstable oblique tear of the posterior portion of the medial meniscus not affecting the root. 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 The articular surfaces all were in satisfactory condition, other than focal grade 2 chondromalacial changes involving the central portion of the femoral trochlea as well as along the medial edge of the medial femoral condyle, consistent with a "kissing lesion", caused by the symptomatic plica. The anterior and posterior cruciate ligaments both were in satisfactory condition.  Complications:   None.  EBL:   2 cc.  Total fluids:   500 cc of crystalloid.  Tourniquet time:   None  Drains:   None  Closure:   4-0 Prolene interrupted sutures.  Brief clinical note:   The patient is a 53 year old female with a several month history of medial sided left knee pain following an extended period of time kneeling on her knees gardening. Her symptoms have persisted despite medications, activity modification, etc. Her history examination consistent with a medial meniscus tear, confirmed by MRI scan. The patient presents at this time for arthroscopy, debridement, and repair versus partial medial and/or lateral meniscectomies.  Procedure:   The patient was brought into the operating room and lain  in the supine position. After adequate general laryngeal mask anesthesia was obtained, a timeout was performed to verify the appropriate side. The patient's left knee was injected sterilely using a solution of 30 cc of 1% lidocaine and 30 cc of 0.5% Sensorcaine with epinephrine. The left lower extremity was prepped with ChloraPrep solution before being draped sterilely. Preoperative antibiotics were administered. The expected portal sites were injected with 0.5% Sensorcaine with epinephrine before the camera was placed in the anterolateral portal and instrumentation performed through the anteromedial portal.   The knee was sequentially examined beginning in the suprapatellar pouch, then progressing to the patellofemoral space, the medial gutter and compartment, the notch, and finally the lateral compartment and gutter. The findings were as described above. Abundant reactive synovial tissues anteriorly were debrided using the full-radius resector in order to improve visualization. During this debridement, a medial plica was identified which was debrided out as it appeared to be abrading the medial edge of the medial femoral condyle.   The medial meniscus tear was identified and probed. Frayed edges were debrided back to stable margins using the full-radius dissector before the unstable flap component was repaired using three FasT-Fix 360 meniscal repair devices. Subsequent probing of the repair and remaining rim demonstrated excellent stability.   Laterally, there was a discoid lateral meniscus with fraying of the central portion. This was debrided back to stable margins, saucerizing the discoid meniscus in the process, using a side-biting basket.   Finally, the area of grade 2 chondromalacial changes involving the femoral trochlea was debrided back to stable margins using the full-radius resector. The instruments were removed from the joint after suctioning the excess  fluid.   The portal sites were closed  using 4-0 Prolene interrupted sutures before a sterile bulky dressing was applied to the knee. The patient was then awakened, extubated, and returned to the recovery room in satisfactory condition after tolerating the procedure well.

## 2019-03-13 ENCOUNTER — Encounter: Payer: Self-pay | Admitting: Surgery

## 2019-03-13 NOTE — Anesthesia Postprocedure Evaluation (Signed)
Anesthesia Post Note  Patient: ELBERTA LACHAPELLE  Procedure(s) Performed: KNEE ARTHROSCOPY WITH DEBRIDEMENT AND REPAIR VERSUS PARTIAL MEDIAL AND LATERAL MENISECTOMY (Left Knee)  Patient location during evaluation: PACU Anesthesia Type: General Level of consciousness: awake and alert Pain management: pain level controlled Vital Signs Assessment: post-procedure vital signs reviewed and stable Respiratory status: spontaneous breathing, nonlabored ventilation, respiratory function stable and patient connected to nasal cannula oxygen Cardiovascular status: blood pressure returned to baseline and stable Postop Assessment: no apparent nausea or vomiting Anesthetic complications: no     Last Vitals:  Vitals:   03/12/19 1216 03/12/19 1317  BP: (!) 141/84 (!) 148/79  Pulse: 81 81  Resp: 18 18  Temp: (!) 36.2 C   SpO2: 90% 96%    Last Pain:  Vitals:   03/12/19 1317  TempSrc:   PainSc: 0-No pain                 Precious Haws Piscitello

## 2019-08-19 ENCOUNTER — Other Ambulatory Visit: Payer: Self-pay | Admitting: Obstetrics and Gynecology

## 2019-08-19 DIAGNOSIS — Z1231 Encounter for screening mammogram for malignant neoplasm of breast: Secondary | ICD-10-CM

## 2019-09-08 ENCOUNTER — Other Ambulatory Visit: Payer: Self-pay

## 2019-09-08 ENCOUNTER — Encounter: Payer: Self-pay | Admitting: Neurology

## 2019-09-08 ENCOUNTER — Ambulatory Visit (INDEPENDENT_AMBULATORY_CARE_PROVIDER_SITE_OTHER): Payer: 59 | Admitting: Neurology

## 2019-09-08 VITALS — BP 136/87 | HR 78 | Temp 94.4°F | Ht 63.0 in | Wt 211.0 lb

## 2019-09-08 DIAGNOSIS — G4719 Other hypersomnia: Secondary | ICD-10-CM | POA: Diagnosis not present

## 2019-09-08 DIAGNOSIS — R0683 Snoring: Secondary | ICD-10-CM | POA: Diagnosis not present

## 2019-09-08 DIAGNOSIS — E669 Obesity, unspecified: Secondary | ICD-10-CM | POA: Diagnosis not present

## 2019-09-08 DIAGNOSIS — I493 Ventricular premature depolarization: Secondary | ICD-10-CM

## 2019-09-08 DIAGNOSIS — G4763 Sleep related bruxism: Secondary | ICD-10-CM | POA: Diagnosis not present

## 2019-09-08 DIAGNOSIS — R609 Edema, unspecified: Secondary | ICD-10-CM

## 2019-09-08 NOTE — Progress Notes (Signed)
Subjective:    Patient ID: Deanna Franco is a 54 y.o. female.  HPI     Huston Foley, MD, PhD [facility] [address] [address] [place], [place] [address]  Dear Dr. Sophronia Simas,     I saw your patient, Deanna Franco, upon your kind request in my neurologic clinic today for evaluation of her sleep disorder, in particular, concern for sleep apnea.  The patient is unaccompanied today.  As you know, Deanna Franco is a 54 year old right-handed woman with an underlying medical history of hypertension, recurrent headaches, anemia, hyperlipidemia, edema and obesity, who reports snoring and excessive daytime somnolence.  I evaluated her over 3 years ago for similar problems, she was advised to proceed with a sleep study.  She did not pursue it at the time.  Her Epworth sleepiness score is 12 out of 24, fatigue severity score is 22 out of 63.  She reports snoring that bothers her fianc.  She is not bothered by her own snoring and denies any witnessed apneas or gasping sensations, denies palpitations or shortness of breath or morning headaches or night to night nocturia.  She does get sleepy when she is sedentary or rides in the car as a passenger.  Sleep is somewhat interrupted because of her pets she reports.  She also reports tooth grinding.  She has bought an over-the-counter mouth appliance for snoring which helps but is not 100% effective she admits.  She drinks caffeine and limitation, 1 cup/day on average in the form of coffee, no soda typically.  She does not drink alcohol, she is a non-smoker.  She lives with her fianc, 2 cats in the home.  She works as a Software engineer.  She is not aware of any family history of OSA.  Weight has been fairly stable.  Bedtime is generally between 930 and 10 and rise time is around 4:45 AM.  She had blood work through her PCP at a virtual visit recently.  Previously:   05/30/2016: 54 year old right-handed woman with an  underlying medical history of hypertension, edema, hyperlipidemia, migraine headaches, anemia, and obesity, who reports snoring and excessive daytime somnolence. I reviewed your office note from 04/24/2016 which you kindly included. She has been on blood pressure medication and weight loss medication. Her Epworth sleepiness score is 13 out of 24 today, her fatigue score is 38 out of 63.She does not wake up rested.  Snoring can be loud and this is corroborated by her own children and her current BF.  She has occasional RLS symptoms, unsure if she kicks in her sleep. She tries to be in bed around 10 PM, she does not watch TV in bed. Wakeup time is 5:30 AM. She has no significant difficulty falling asleep or staying asleep but sleep can be disrupted by their 2 cats. She has 2 grown children, daughter is 83 and son is 27. She lives with her boyfriend and his 2 teenage daughters. She denies nocturia on a night to night basis and denies any significant morning headaches. A couple of years ago she had more difficulty with blood pressure management. Blood pressure is currently under control and she only takes hydrochlorothiazide. Her weight loss medication helps her lose weight to some degree but primarily maintain weight. When she has come off of her wt loss medication she often tends to gain weight.  Her Past Medical History Is Significant For: Past Medical History:  Diagnosis Date  . Arthritis   . Dyslipidemia   .  Edema   . Fibromyalgia    ? not confirmed   . Headache   . Hypertension    H/O JOB RELATED  . Stroke [facility])    not confirmed, possible in 2009    Her Past Surgical History Is Significant For: Past Surgical History:  Procedure Laterality Date  . ABDOMINAL HYSTERECTOMY    . CESAREAN SECTION    . KNEE ARTHROSCOPY WITH LATERAL MENISECTOMY Left 03/12/2019   Procedure: KNEE ARTHROSCOPY WITH DEBRIDEMENT AND REPAIR VERSUS PARTIAL MEDIAL AND LATERAL MENISECTOMY;  Surgeon: Christena Flake, MD;   Location: [facility] ORS;  Service: Orthopedics;  Laterality: Left;    Her Family History Is Significant For: Family History  Problem Relation Age of Onset  . Lung cancer Mother     Her Social History Is Significant For: Social History   Socioeconomic History  . Marital status: Divorced    Spouse name: Not on file  . Number of children: 2  . Years of education: HS  . Highest education level: Not on file  Occupational History  . Not on file  Tobacco Use  . Smoking status: Never Smoker  . Smokeless tobacco: Never Used  Substance and Sexual Activity  . Alcohol use: No  . Drug use: No  . Sexual activity: Not on file  Other Topics Concern  . Not on file  Social History Narrative   Rare caffeine use    Social Determinants of Health   Financial Resource Strain:   . Difficulty of Paying Living Expenses: Not on file  Food Insecurity:   . Worried About Programme researcher, broadcasting/film/video in the Last Year: Not on file  . Ran Out of Food in the Last Year: Not on file  Transportation Needs:   . Lack of Transportation (Medical): Not on file  . Lack of Transportation (Non-Medical): Not on file  Physical Activity:   . Days of Exercise per Week: Not on file  . Minutes of Exercise per Session: Not on file  Stress:   . Feeling of Stress : Not on file  Social Connections:   . Frequency of Communication with Friends and Family: Not on file  . Frequency of Social Gatherings with Friends and Family: Not on file  . Attends Religious Services: Not on file  . Active Member of Clubs or Organizations: Not on file  . Attends Banker Meetings: Not on file  . Marital Status: Not on file    Her Allergies Are:  Allergies  Allergen Reactions  . Penicillins Other (See Comments)    Did it involve swelling of the face/tongue/throat, SOB, or low BP? Unknown Did it involve sudden or severe rash/hives, skin peeling, or any reaction on the inside of your mouth or nose? Unknown Did you need to seek  medical attention at a hospital or doctor's office? Unknown-pt believes she was in the hospital when reaction occurred When did it last happen?Age 29 (childhood reaction) If all above answers are "NO", may proceed with cephalosporin use.    Marland Kitchen Neomycin Swelling and Rash  . Sulfa Antibiotics Swelling and Rash  :   Her Current Medications Are:  Outpatient Encounter Medications as of 09/08/2019  Medication Sig  . hydrochlorothiazide (HYDRODIURIL) 25 MG tablet Take 25 mg by mouth daily.   Marland Kitchen QSYMIA 11.25-69 MG CP24 Take 1 capsule by mouth daily.  . [DISCONTINUED] ASTAXANTHIN PO Take 12 mg by mouth daily. BioAstin [place] Astaxanthin  . [DISCONTINUED] HYDROcodone-acetaminophen (NORCO) 5-325 MG tablet Take 1-2 tablets by  mouth every 6 (six) hours as needed for moderate pain. MAXIMUM TOTAL ACETAMINOPHEN DOSE IS 4000 MG PER DAY  . [DISCONTINUED] Rhubarb (ESTROVEN MENOPAUSE RELIEF PO) Take 1 tablet by mouth daily.   No facility-administered encounter medications on file as of 09/08/2019.  :  Review of Systems:  Out of a complete 14 point review of systems, all are reviewed and negative with the exception of these symptoms as listed below: Review of Systems  Neurological:       Here for sleep consult. No prior sleep study. Reports she snores heavily and grinds her teeth.  Epworth Sleepiness Scale 0= would never doze 1= slight chance of dozing 2= moderate chance of dozing 3= high chance of dozing  Sitting and reading:2 Watching TV:3 Sitting inactive in a public place (ex. Theater or meeting):0 As a passenger in a car for an hour without a break:3 Lying down to rest in the afternoon:3 Sitting and talking to someone:0 Sitting quietly after lunch (no alcohol):1 In a car, while stopped in traffic:0 Total:12     Objective:  Neurological Exam  Physical Exam Physical Examination:   Vitals:   09/08/19 1559  BP: 136/87  Pulse: 78  Temp: (!) 94.4 F (34.7 C)    General Examination:  The patient is a very pleasant 54 y.o. female in no acute distress. She appears well-developed and well-nourished and well groomed.   HEENT: Normocephalic, atraumatic, pupils are equal, round and reactive to light, extraocular tracking is good without limitation to gaze excursion or nystagmus noted. Hearing is grossly intact. Face is symmetric with normal facial animation. Speech is clear with no dysarthria noted. There is no hypophonia. There is no lip, neck/head, jaw or voice tremor. Neck is supple with full range of passive and active motion. There are no carotid bruits on auscultation. Oropharynx exam reveals: moderate mouth dryness, adequate dental hygiene and moderate airway crowding, due to smaller airway entry, Mallampati is class II. Tongue protrudes centrally and palate elevates symmetrically. Tonsils are absent. Neck size is 16.25 inches.  Chest: Clear to auscultation without wheezing, rhonchi or crackles noted.  Heart: S1+S2+0, mildly irregular, with what could be frequent PVCs with compensatory pauses noted.  Not completely irregularly irregular upon repeated examination.   Abdomen: Soft, non-tender and non-distended with normal bowel sounds appreciated on auscultation.  Extremities: There is trace pitting edema in the distal lower extremities bilaterally.   Skin: Warm and dry without trophic changes noted.   Musculoskeletal: exam reveals no obvious joint deformities, tenderness or joint swelling or erythema.   Neurologically:  Mental status: The patient is awake, alert and oriented in all 4 spheres. Her immediate and remote memory, attention, language skills and fund of knowledge are appropriate. There is no evidence of aphasia, agnosia, apraxia or anomia. Speech is clear with normal prosody and enunciation. Thought process is linear. Mood is normal and affect is normal.  Cranial nerves II - XII are as described above under HEENT exam.  Motor exam: Normal bulk, strength and tone is  noted. There is no tremor, Romberg is negative.  Fine motor skills and coordination: grossly intact.  Cerebellar testing: No dysmetria or intention tremor. There is no truncal or gait ataxia.  Sensory exam: intact to light touch in the upper and lower extremities.  Gait, station and balance: She stands easily. No veering to one side is noted. No leaning to one side is noted. Posture is age-appropriate and stance is narrow based. Gait shows normal stride length and normal pace.  No problems turning are noted.  Tandem walk is unremarkable.                Assessment and plan:  In summary, TORRIA FROMER is a very pleasant 54 y.o.-year old female with an underlying medical history of hypertension, recurrent headaches, anemia, hyperlipidemia, edema and obesity, who presents for evaluation of her sleep disturbance, with her history and examination concerning for underlying obstructive sleep apnea.  She also reports nighttime bruxism.  I noted some irregularity on her heart auscultation today, she has no history of palpitations or chest pain or shortness of breath, she may have frequent PVCs.  She is advised to talk to her PCP about getting a formal EKG done in his office.  She is agreeable. I had a long chat with the patient about my findings and the diagnosis of OSA, its prognosis and treatment options. We talked about medical treatments, surgical interventions and non-pharmacological approaches. I explained in particular the risks and ramifications of untreated moderate to severe OSA, especially with respect to developing cardiovascular disease down the Road, including congestive heart failure, difficult to treat hypertension, cardiac arrhythmias, or stroke. Even type 2 diabetes has, in part, been linked to untreated OSA. Symptoms of untreated OSA include daytime sleepiness, memory problems, mood irritability and mood disorder such as depression and anxiety, lack of energy, as well as recurrent headaches,  especially morning headaches. We talked about trying to maintain a healthy lifestyle in general, as well as the importance of weight control. We also talked about the importance of good sleep hygiene. I recommended the following at this time: sleep study.   I explained the sleep test procedure to the patient and also outlined possible surgical and non-surgical treatment options of OSA. I also explained the CPAP treatment option to the patient. I answered all her questions today and the patient was in agreement. I plan to see her back after the sleep study is completed and encouraged her to call with any interim questions, concerns, problems or updates.   Thank you very much for allowing me to participate in the care of this nice patient. If I can be of any further assistance to you please do not hesitate to call me at [phone].  Sincerely,   Star Age, MD, PhD

## 2019-09-08 NOTE — Patient Instructions (Addendum)
I noticed some irregularity on your heart rhythm. You may have frequent PVCs, extra beats with some short pauses noted. As discussed, please get in touch with your primary care physician, Dr. Clelia Croft about getting a formal EKG done in his office.  If you have palpitations or any chest pain or shortness of breath, please seek urgent medical attention.    Based on your symptoms and your exam I believe you are at risk for obstructive sleep apnea (aka OSA), and I think we should proceed with a sleep study to determine whether you do or do not have OSA and how severe it is. Even, if you have mild OSA, I may want you to consider treatment with CPAP, as treatment of even borderline or mild sleep apnea can result and improvement of symptoms such as sleep disruption, daytime sleepiness, nighttime bathroom breaks, restless leg symptoms, improvement of headache syndromes, even improved mood disorder.   Please remember, the long-term risks and ramifications of untreated moderate to severe obstructive sleep apnea are: increased Cardiovascular disease, including congestive heart failure, stroke, difficult to control hypertension, treatment resistant obesity, arrhythmias, especially irregular heartbeat commonly known as A. Fib. (atrial fibrillation); even type 2 diabetes has been linked to untreated OSA.   Sleep apnea can cause disruption of sleep and sleep deprivation in most cases, which, in turn, can cause recurrent headaches, problems with memory, mood, concentration, focus, and vigilance. Most people with untreated sleep apnea report excessive daytime sleepiness, which can affect their ability to drive. Please do not drive if you feel sleepy. Patients with sleep apnea developed difficulty initiating and maintaining sleep (aka insomnia).   Having sleep apnea may increase your risk for other sleep disorders, including involuntary behaviors sleep such as sleep terrors, sleep talking, sleepwalking.    Having sleep apnea  can also increase your risk for restless leg syndrome and leg movements at night.   Please note that untreated obstructive sleep apnea may carry additional perioperative morbidity. Patients with significant obstructive sleep apnea (typically, in the moderate to severe degree) should receive, if possible, perioperative PAP (positive airway pressure) therapy and the surgeons and particularly the anesthesiologists should be informed of the diagnosis and the severity of the sleep disordered breathing.   I will likely see you back after your sleep study to go over the test results and where to go from there. We will call you after your sleep study to advise about the results (most likely, you will hear from [place], my nurse) and to set up an appointment at the time, as necessary.    Our sleep lab administrative assistant will call you to schedule your sleep study and give you further instructions, regarding the check in process for the sleep study, arrival time, what to bring, when you can expect to leave after the study, etc., and to answer any other logistical questions you may have. If you don't hear back from her by about 2 weeks from now, please feel free to call her direct line at [phone] or you can call our general clinic number, or email [place] through My Chart.

## 2019-09-16 ENCOUNTER — Ambulatory Visit
Admission: RE | Admit: 2019-09-16 | Discharge: 2019-09-16 | Disposition: A | Discharge status: Home / Self Care | Payer: 59 | Source: Ambulatory Visit | Attending: Obstetrics and Gynecology | Admitting: Obstetrics and Gynecology

## 2019-09-16 ENCOUNTER — Other Ambulatory Visit: Payer: Self-pay

## 2019-09-16 DIAGNOSIS — Z1231 Encounter for screening mammogram for malignant neoplasm of breast: Secondary | ICD-10-CM

## 2019-09-17 ENCOUNTER — Other Ambulatory Visit: Payer: Self-pay | Admitting: Obstetrics and Gynecology

## 2019-09-17 DIAGNOSIS — R928 Other abnormal and inconclusive findings on diagnostic imaging of breast: Secondary | ICD-10-CM

## 2019-10-05 ENCOUNTER — Telehealth: Payer: Self-pay

## 2019-10-05 NOTE — Telephone Encounter (Signed)
We have attempted to call the patient two times to schedule sleep study.  Patient has been unavailable at the phone numbers we have on file and has not returned our calls. If patient calls back we will schedule them for their sleep study.  

## 2019-10-15 ENCOUNTER — Ambulatory Visit
Admission: RE | Admit: 2019-10-15 | Discharge: 2019-10-15 | Disposition: A | Discharge status: Home / Self Care | Payer: 59 | Source: Ambulatory Visit | Attending: Obstetrics and Gynecology | Admitting: Obstetrics and Gynecology

## 2019-10-15 ENCOUNTER — Other Ambulatory Visit: Payer: Self-pay

## 2019-10-15 ENCOUNTER — Other Ambulatory Visit: Payer: Self-pay | Admitting: Obstetrics and Gynecology

## 2019-10-15 DIAGNOSIS — R928 Other abnormal and inconclusive findings on diagnostic imaging of breast: Secondary | ICD-10-CM

## 2019-10-15 DIAGNOSIS — R921 Mammographic calcification found on diagnostic imaging of breast: Secondary | ICD-10-CM

## 2020-01-18 ENCOUNTER — Ambulatory Visit
Admission: EM | Admit: 2020-01-18 | Discharge: 2020-01-18 | Disposition: A | Discharge status: Home / Self Care | Payer: 59 | Attending: Physician Assistant | Admitting: Physician Assistant

## 2020-01-18 ENCOUNTER — Encounter: Payer: Self-pay | Admitting: Emergency Medicine

## 2020-01-18 ENCOUNTER — Ambulatory Visit (INDEPENDENT_AMBULATORY_CARE_PROVIDER_SITE_OTHER): Payer: 59

## 2020-01-18 ENCOUNTER — Other Ambulatory Visit: Payer: Self-pay

## 2020-01-18 ENCOUNTER — Telehealth: Payer: Self-pay | Admitting: Emergency Medicine

## 2020-01-18 DIAGNOSIS — M79674 Pain in right toe(s): Secondary | ICD-10-CM

## 2020-01-18 DIAGNOSIS — W2209XA Striking against other stationary object, initial encounter: Secondary | ICD-10-CM

## 2020-01-18 MED ORDER — MELOXICAM 7.5 MG PO TABS: 7.5000 mg | ORAL_TABLET | Freq: Every day | ORAL | 0 refills | Status: AC

## 2020-01-18 NOTE — Discharge Instructions (Signed)
Xray negative for fracture or dislocation. Ibuprofen 600-800mg  three times a day or naproxen 440mg  twice a day for at least 5-7 days. Ice compress, rest, post op shoe during activity. This may take a few weeks to completely resolve, but should be feeling better each week. Follow up with orthopedics if symptoms not improving.

## 2020-01-18 NOTE — ED Triage Notes (Signed)
Pt presents to [facility] for assessment of right foot pain after kicking a shopping cart with her pinky toe 1 week ago.  States she has been trying to care for it at home, but cannot find a way to buddy tape it without severe pain.

## 2020-01-18 NOTE — ED Provider Notes (Signed)
[facility]    CSN: [id number] Arrival date & time: 01/18/20  0806      History   Chief Complaint Chief Complaint  Patient presents with  . Foot Pain    HPI Deanna Franco is a 54 y.o. female.   54 year old female comes in for 1 week history of 5th toe pain of the right foot after injury. Had kicked a shopping cart. States since then has had pain to the 5th toe, worse with movement/weightbearing. Denies numbness/tingling. Has been trying to buddy tape, but causes more pain. Tylenol without relief.      Past Medical History:  Diagnosis Date  . Arthritis   . Dyslipidemia   . Edema   . Fibromyalgia    ? not confirmed   . Headache   . Hypertension    H/O JOB RELATED  . Stroke [facility])    not confirmed, possible in 2009    There are no problems to display for this patient.   Past Surgical History:  Procedure Laterality Date  . ABDOMINAL HYSTERECTOMY    . CESAREAN SECTION    . KNEE ARTHROSCOPY WITH LATERAL MENISECTOMY Left 03/12/2019   Procedure: KNEE ARTHROSCOPY WITH DEBRIDEMENT AND REPAIR VERSUS PARTIAL MEDIAL AND LATERAL MENISECTOMY;  Surgeon: Christena Flake, MD;  Location: [facility] ORS;  Service: Orthopedics;  Laterality: Left;    OB History   No obstetric history on file.      Home Medications    Prior to Admission medications   Medication Sig Start Date End Date Taking? Authorizing Provider  hydrochlorothiazide (HYDRODIURIL) 25 MG tablet Take 25 mg by mouth daily.     [provider]  QSYMIA 11.25-69 MG CP24 Take 1 capsule by mouth daily. 04/18/16 01/18/20  [provider]    Family History Family History  Problem Relation Age of Onset  . Lung cancer Mother     Social History Social History   Tobacco Use  . Smoking status: Never Smoker  . Smokeless tobacco: Never Used  Vaping Use  . Vaping Use: Never used  Substance Use Topics  . Alcohol use: No  . Drug use: No     Allergies   Penicillins, Neomycin, and Sulfa  antibiotics   Review of Systems Review of Systems  Reason unable to perform ROS: See HPI as above.     Physical Exam Triage Vital Signs ED Triage Vitals  Enc Vitals Group     BP 01/18/20 0823 (!) 137/94     Pulse Rate 01/18/20 0823 67     Resp 01/18/20 0823 16     Temp 01/18/20 0823 98.2 F (36.8 C)     Temp Source 01/18/20 0823 Oral     SpO2 01/18/20 0823 95 %     Weight --      Height --      Head Circumference --      Peak Flow --      Pain Score 01/18/20 0822 3     Pain Loc --      Pain Edu? --      Excl. in GC? --    No data found.  Updated Vital Signs BP (!) 137/94 (BP Location: Left Arm)   Pulse 67   Temp 98.2 F (36.8 C) (Oral)   Resp 16   SpO2 95%   Physical Exam Constitutional:      General: She is not in acute distress.    Appearance: Normal appearance. She is well-developed. She  is not toxic-appearing or diaphoretic.  HENT:     Head: Normocephalic and atraumatic.  Eyes:     Conjunctiva/sclera: Conjunctivae normal.     Pupils: Pupils are equal, round, and reactive to light.  Pulmonary:     Effort: Pulmonary effort is normal. No respiratory distress.  Musculoskeletal:     Cervical back: Normal range of motion and neck supple.     Comments: No obvious swelling, erythema, warmth, contusion. Tenderness to palpation of distal 5th MTP, PIP. Full ROM of ankle/toe. NVI  Skin:    General: Skin is warm and dry.  Neurological:     Mental Status: She is alert and oriented to person, place, and time.    UC Treatments / Results  Labs (all labs ordered are listed, but only abnormal results are displayed) Labs Reviewed - No data to display  EKG   Radiology DG Foot Complete Right  Result Date: 01/18/2020 CLINICAL DATA:  Right small toe pain for 1 week following injury EXAM: RIGHT FOOT COMPLETE - 3+ VIEW COMPARISON:  04/25/2009 FINDINGS: There is no evidence of fracture or dislocation. Plantar calcaneal spur. Joint spaces maintained. There is no evidence  of arthropathy or other focal bone abnormality. Soft tissues are unremarkable. IMPRESSION: Negative. Electronically Signed   By: Duanne Guess D.O.   On: 01/18/2020 08:37    Procedures Procedures (including critical care time)  Medications Ordered in UC Medications - No data to display  Initial Impression / Assessment and Plan / UC Course  I have reviewed the triage vital signs and the nursing notes.  Pertinent labs & imaging results that were available during my care of the patient were reviewed by me and considered in my medical decision making (see chart for details).    Xray reviewed by me negative for fracture or dislocation. Will provide post op shoe for symptomatic management. NSAIDs, ice compress, rest. Expected course of healing discussed. Return precautions given.  Final Clinical Impressions(s) / UC Diagnoses   Final diagnoses:  Toe pain, right   ED Prescriptions    None     PDMP not reviewed this encounter.   Belinda Fisher, PA-C 01/18/20 [phone]

## 2020-01-18 NOTE — ED Notes (Signed)
Patient able to ambulate independently  

## 2020-01-18 NOTE — Telephone Encounter (Signed)
Patient called stating provider offered her a medication similar to ibuprofen that she can take less often.  Spoke to Amy, APP who verbal ordered Mobic prescription for patient, sent to pharmacy on file.

## 2020-04-15 ENCOUNTER — Other Ambulatory Visit: Payer: Self-pay | Admitting: Obstetrics and Gynecology

## 2020-04-15 ENCOUNTER — Ambulatory Visit
Admission: RE | Admit: 2020-04-15 | Discharge: 2020-04-15 | Disposition: A | Discharge status: Home / Self Care | Payer: 59 | Source: Ambulatory Visit | Attending: Obstetrics and Gynecology | Admitting: Obstetrics and Gynecology

## 2020-04-15 ENCOUNTER — Other Ambulatory Visit: Payer: Self-pay

## 2020-04-15 DIAGNOSIS — R921 Mammographic calcification found on diagnostic imaging of breast: Secondary | ICD-10-CM

## 2020-10-11 ENCOUNTER — Other Ambulatory Visit: Payer: Self-pay | Admitting: Internal Medicine

## 2020-10-11 DIAGNOSIS — E782 Mixed hyperlipidemia: Secondary | ICD-10-CM

## 2020-10-17 ENCOUNTER — Ambulatory Visit
Admission: RE | Admit: 2020-10-17 | Discharge: 2020-10-17 | Disposition: A | Discharge status: Home / Self Care | Payer: BC Managed Care – PPO | Source: Ambulatory Visit | Attending: Obstetrics and Gynecology | Admitting: Obstetrics and Gynecology

## 2020-10-17 ENCOUNTER — Other Ambulatory Visit: Payer: Self-pay

## 2020-10-17 DIAGNOSIS — R921 Mammographic calcification found on diagnostic imaging of breast: Secondary | ICD-10-CM

## 2020-12-19 ENCOUNTER — Emergency Department (HOSPITAL_COMMUNITY): Payer: BC Managed Care – PPO

## 2020-12-19 ENCOUNTER — Emergency Department (HOSPITAL_COMMUNITY)
Admission: EM | Admit: 2020-12-19 | Discharge: 2020-12-19 | Disposition: A | Discharge status: Home / Self Care | Payer: BC Managed Care – PPO | Attending: Emergency Medicine | Admitting: Emergency Medicine

## 2020-12-19 ENCOUNTER — Other Ambulatory Visit: Payer: Self-pay

## 2020-12-19 ENCOUNTER — Encounter (HOSPITAL_COMMUNITY): Payer: Self-pay

## 2020-12-19 DIAGNOSIS — R0789 Other chest pain: Secondary | ICD-10-CM | POA: Diagnosis not present

## 2020-12-19 DIAGNOSIS — Q8909 Congenital malformations of spleen: Secondary | ICD-10-CM | POA: Diagnosis not present

## 2020-12-19 DIAGNOSIS — K760 Fatty (change of) liver, not elsewhere classified: Secondary | ICD-10-CM | POA: Diagnosis not present

## 2020-12-19 DIAGNOSIS — R300 Dysuria: Secondary | ICD-10-CM | POA: Diagnosis not present

## 2020-12-19 DIAGNOSIS — R319 Hematuria, unspecified: Secondary | ICD-10-CM | POA: Diagnosis not present

## 2020-12-19 DIAGNOSIS — Z79899 Other long term (current) drug therapy: Secondary | ICD-10-CM | POA: Insufficient documentation

## 2020-12-19 DIAGNOSIS — I10 Essential (primary) hypertension: Secondary | ICD-10-CM | POA: Diagnosis not present

## 2020-12-19 DIAGNOSIS — I491 Atrial premature depolarization: Secondary | ICD-10-CM | POA: Diagnosis not present

## 2020-12-19 DIAGNOSIS — R001 Bradycardia, unspecified: Secondary | ICD-10-CM | POA: Insufficient documentation

## 2020-12-19 DIAGNOSIS — I493 Ventricular premature depolarization: Secondary | ICD-10-CM | POA: Diagnosis not present

## 2020-12-19 DIAGNOSIS — K5750 Diverticulosis of both small and large intestine without perforation or abscess without bleeding: Secondary | ICD-10-CM | POA: Diagnosis not present

## 2020-12-19 DIAGNOSIS — K828 Other specified diseases of gallbladder: Secondary | ICD-10-CM | POA: Diagnosis not present

## 2020-12-19 LAB — COMPREHENSIVE METABOLIC PANEL
ALT: 28 U/L (ref 0–44)
AST: 26 U/L (ref 15–41)
Albumin: 4.1 g/dL (ref 3.5–5.0)
Alkaline Phosphatase: 73 U/L (ref 38–126)
Anion gap: 9 (ref 5–15)
BUN: 10 mg/dL (ref 6–20)
CO2: 31 mmol/L (ref 22–32)
Calcium: 9.5 mg/dL (ref 8.9–10.3)
Chloride: 102 mmol/L (ref 98–111)
Creatinine, Ser: 0.63 mg/dL (ref 0.44–1.00)
GFR, Estimated: >60 mL/min (ref >60)
Glucose, Bld: 100 mg/dL — ABNORMAL HIGH (ref 70–99)
Potassium: 3.5 mmol/L (ref 3.5–5.1)
Sodium: 142 mmol/L (ref 135–145)
Total Bilirubin: 0.3 mg/dL (ref 0.3–1.2)
Total Protein: 7.3 g/dL (ref 6.5–8.1)

## 2020-12-19 LAB — CBC WITH DIFFERENTIAL/PLATELET
Abs Immature Granulocytes: 0.02 10*3/uL (ref 0.00–0.07)
Basophils Absolute: 0.1 10*3/uL (ref 0.0–0.1)
Basophils Relative: 1 %
Eosinophils Absolute: 0.2 10*3/uL (ref 0.0–0.5)
Eosinophils Relative: 2 %
HCT: 42.8 % (ref 36.0–46.0)
Hemoglobin: 13.9 g/dL (ref 12.0–15.0)
Immature Granulocytes: 0 %
Lymphocytes Relative: 42 %
Lymphs Abs: 2.9 10*3/uL (ref 0.7–4.0)
MCH: 28.4 pg (ref 26.0–34.0)
MCHC: 32.5 g/dL (ref 30.0–36.0)
MCV: 87.5 fL (ref 80.0–100.0)
Monocytes Absolute: 0.5 10*3/uL (ref 0.1–1.0)
Monocytes Relative: 7 %
Neutro Abs: 3.4 10*3/uL (ref 1.7–7.7)
Neutrophils Relative %: 48 %
Platelets: 249 10*3/uL (ref 150–400)
RBC: 4.89 MIL/uL (ref 3.87–5.11)
RDW: 13.1 % (ref 11.5–15.5)
WBC: 7 10*3/uL (ref 4.0–10.5)
nRBC: 0 % (ref 0.0–0.2)

## 2020-12-19 LAB — URINALYSIS, ROUTINE W REFLEX MICROSCOPIC
Bilirubin Urine: NEGATIVE
Glucose, UA: NEGATIVE mg/dL
Ketones, ur: NEGATIVE mg/dL
Leukocytes,Ua: NEGATIVE
Nitrite: NEGATIVE
Protein, ur: NEGATIVE mg/dL
Specific Gravity, Urine: 1.004 — ABNORMAL LOW (ref 1.005–1.030)
pH: 6 (ref 5.0–8.0)

## 2020-12-19 NOTE — ED Provider Notes (Signed)
[facility]-EMERGENCY DEPT Provider Note   CSN: [id number] Arrival date & time: 12/19/20  1536     History Chief Complaint  Patient presents with   Bradycardia    Deanna Franco is a 55 y.o. female.  55 year old female presents from urgent care due to concern for low heart rate.  Patient was there because she was having UTI type symptoms consisting of hematuria.  Denies any dysuria.  Some flank pain which is since resolved.  Denies any vomiting or diarrhea.  He denies any syncope or near syncope.  Patient found to have multiple PVCs while at urgent care.  Denies any prior history of same.        Past Medical History:  Diagnosis Date   Arthritis    Dyslipidemia    Edema    Fibromyalgia    ? not confirmed    Headache    Hypertension    H/O JOB RELATED   Stroke [facility])    not confirmed, possible in 2009    There are no problems to display for this patient.   Past Surgical History:  Procedure Laterality Date   ABDOMINAL HYSTERECTOMY     CESAREAN SECTION     KNEE ARTHROSCOPY WITH LATERAL MENISECTOMY Left 03/12/2019   Procedure: KNEE ARTHROSCOPY WITH DEBRIDEMENT AND REPAIR VERSUS PARTIAL MEDIAL AND LATERAL MENISECTOMY;  Surgeon: Christena Flake, MD;  Location: [facility] ORS;  Service: Orthopedics;  Laterality: Left;     OB History   No obstetric history on file.     Family History  Problem Relation Age of Onset   Lung cancer Mother     Social History   Tobacco Use   Smoking status: Never   Smokeless tobacco: Never  Vaping Use   Vaping Use: Never used  Substance Use Topics   Alcohol use: No   Drug use: No    Home Medications Prior to Admission medications   Medication Sig Start Date End Date Taking? Authorizing Provider  hydrochlorothiazide (HYDRODIURIL) 25 MG tablet Take 25 mg by mouth daily.     [provider]  meloxicam (MOBIC) 7.5 MG tablet Take 1 tablet (7.5 mg total) by mouth daily. 01/18/20   Cathie Hoops, Amy V, PA-C  QSYMIA  11.25-69 MG CP24 Take 1 capsule by mouth daily. 04/18/16 01/18/20  [provider]    Allergies    Penicillins, Neomycin, and Sulfa antibiotics  Review of Systems   Review of Systems  All other systems reviewed and are negative.  Physical Exam Updated Vital Signs BP (!) 153/92   Pulse 75   Temp 98 F (36.7 C)   Resp (!) 23   Ht 1.6 m (5\' 3" )   Wt 98.4 kg   SpO2 97%   BMI 38.44 kg/m   Physical Exam Vitals and nursing note reviewed.  Constitutional:      General: She is not in acute distress.    Appearance: Normal appearance. She is well-developed. She is not toxic-appearing.  HENT:     Head: Normocephalic and atraumatic.  Eyes:     General: Lids are normal.     Conjunctiva/sclera: Conjunctivae normal.     Pupils: Pupils are equal, round, and reactive to light.  Neck:     Thyroid: No thyroid mass.     Trachea: No tracheal deviation.  Cardiovascular:     Rate and Rhythm: Normal rate. Rhythm irregular.     Heart sounds: Normal heart sounds. No murmur heard.   No gallop.  Pulmonary:  Effort: Pulmonary effort is normal. No respiratory distress.     Breath sounds: Normal breath sounds. No stridor. No decreased breath sounds, wheezing, rhonchi or rales.  Abdominal:     General: There is no distension.     Palpations: Abdomen is soft.     Tenderness: There is no abdominal tenderness. There is no rebound.  Musculoskeletal:        General: No tenderness. Normal range of motion.     Cervical back: Normal range of motion and neck supple.  Skin:    General: Skin is warm and dry.     Findings: No abrasion or rash.  Neurological:     Mental Status: She is alert and oriented to person, place, and time. Mental status is at baseline.     GCS: GCS eye subscore is 4. GCS verbal subscore is 5. GCS motor subscore is 6.     Cranial Nerves: Cranial nerves are intact. No cranial nerve deficit.     Sensory: No sensory deficit.     Motor: Motor function is intact.   Psychiatric:        Attention and Perception: Attention normal.        Speech: Speech normal.        Behavior: Behavior normal.    ED Results / Procedures / Treatments   Labs (all labs ordered are listed, but only abnormal results are displayed) Labs Reviewed  URINE CULTURE  CBC WITH DIFFERENTIAL/PLATELET  COMPREHENSIVE METABOLIC PANEL  URINALYSIS, ROUTINE W REFLEX MICROSCOPIC    EKG EKG Interpretation  Date/Time:  Monday December 19 2020 16:16:30 EDT Ventricular Rate:  87 PR Interval:  152 QRS Duration: 104 QT Interval:  381 QTC Calculation: 459 R Axis:   66 Text Interpretation: Sinus rhythm Ventricular bigeminy Left atrial enlargement Confirmed by Lorre Nick ([id number]) on 12/19/2020 7:18:31 PM  Radiology No results found.  Procedures Procedures   Medications Ordered in ED Medications - No data to display  ED Course  I have reviewed the triage vital signs and the nursing notes.  Pertinent labs & imaging results that were available during my care of the patient were reviewed by me and considered in my medical decision making (see chart for details).    MDM Rules/Calculators/A&P                         Patient monitored here and occasional PVCs noted.  Work appears reassuring and urinalysis is negative.  Will discharge home Final Clinical Impression(s) / ED Diagnoses Final diagnoses:  None    Rx / DC Orders ED Discharge Orders     None        Lorre Nick, MD 12/19/20 2041

## 2020-12-19 NOTE — ED Triage Notes (Signed)
Pt arrived via [facility] from urgent care. Pt reports a purple-reddish urinary discharge lastnight so she reported to UC for possible UTI. While at UC, HR was noted to be in the 40's with multiple PVCs when EMS arrived she was noted to have runs of bigeminy and trigeminy with PVCs unifocal in nature. Only other complaint is fatigue over the past week. No elevation or depression shown on 12 lead  20 G left AC.

## 2020-12-21 LAB — URINE CULTURE: Culture: 60000 — AB

## 2020-12-26 DIAGNOSIS — R001 Bradycardia, unspecified: Secondary | ICD-10-CM | POA: Diagnosis not present

## 2021-05-18 DIAGNOSIS — J111 Influenza due to unidentified influenza virus with other respiratory manifestations: Secondary | ICD-10-CM | POA: Diagnosis not present

## 2021-08-28 DIAGNOSIS — L720 Epidermal cyst: Secondary | ICD-10-CM | POA: Diagnosis not present

## 2021-11-07 DIAGNOSIS — M7989 Other specified soft tissue disorders: Secondary | ICD-10-CM | POA: Diagnosis not present

## 2021-11-07 DIAGNOSIS — Z0000 Encounter for general adult medical examination without abnormal findings: Secondary | ICD-10-CM | POA: Diagnosis not present

## 2021-11-07 DIAGNOSIS — Z131 Encounter for screening for diabetes mellitus: Secondary | ICD-10-CM | POA: Diagnosis not present

## 2021-11-07 DIAGNOSIS — I10 Essential (primary) hypertension: Secondary | ICD-10-CM | POA: Diagnosis not present

## 2021-11-07 DIAGNOSIS — Z90711 Acquired absence of uterus with remaining cervical stump: Secondary | ICD-10-CM | POA: Diagnosis not present

## 2021-11-28 DIAGNOSIS — R7309 Other abnormal glucose: Secondary | ICD-10-CM | POA: Diagnosis not present

## 2021-11-28 DIAGNOSIS — E785 Hyperlipidemia, unspecified: Secondary | ICD-10-CM | POA: Diagnosis not present

## 2022-01-15 ENCOUNTER — Other Ambulatory Visit: Payer: Self-pay

## 2022-02-05 DIAGNOSIS — Z6841 Body Mass Index (BMI) 40.0 and over, adult: Secondary | ICD-10-CM | POA: Diagnosis not present

## 2022-02-05 DIAGNOSIS — I10 Essential (primary) hypertension: Secondary | ICD-10-CM | POA: Diagnosis not present

## 2022-02-05 DIAGNOSIS — E669 Obesity, unspecified: Secondary | ICD-10-CM | POA: Diagnosis not present

## 2022-02-05 DIAGNOSIS — Z1239 Encounter for other screening for malignant neoplasm of breast: Secondary | ICD-10-CM | POA: Diagnosis not present

## 2022-02-05 DIAGNOSIS — Z79899 Other long term (current) drug therapy: Secondary | ICD-10-CM | POA: Diagnosis not present

## 2022-02-05 DIAGNOSIS — E1169 Type 2 diabetes mellitus with other specified complication: Secondary | ICD-10-CM | POA: Diagnosis not present

## 2022-03-23 DIAGNOSIS — R921 Mammographic calcification found on diagnostic imaging of breast: Secondary | ICD-10-CM | POA: Diagnosis not present

## 2022-07-27 DIAGNOSIS — M542 Cervicalgia: Secondary | ICD-10-CM | POA: Diagnosis not present

## 2022-09-11 DIAGNOSIS — L7211 Pilar cyst: Secondary | ICD-10-CM | POA: Diagnosis not present

## 2022-09-11 DIAGNOSIS — L718 Other rosacea: Secondary | ICD-10-CM | POA: Diagnosis not present

## 2022-09-17 DIAGNOSIS — E782 Mixed hyperlipidemia: Secondary | ICD-10-CM | POA: Diagnosis not present

## 2022-09-17 DIAGNOSIS — Z0000 Encounter for general adult medical examination without abnormal findings: Secondary | ICD-10-CM | POA: Diagnosis not present

## 2022-09-17 DIAGNOSIS — R230 Cyanosis: Secondary | ICD-10-CM | POA: Diagnosis not present

## 2022-09-17 DIAGNOSIS — I10 Essential (primary) hypertension: Secondary | ICD-10-CM | POA: Diagnosis not present

## 2022-09-25 DIAGNOSIS — E782 Mixed hyperlipidemia: Secondary | ICD-10-CM | POA: Diagnosis not present

## 2022-09-25 DIAGNOSIS — Z1331 Encounter for screening for depression: Secondary | ICD-10-CM | POA: Diagnosis not present

## 2022-09-25 DIAGNOSIS — E119 Type 2 diabetes mellitus without complications: Secondary | ICD-10-CM | POA: Diagnosis not present

## 2022-09-25 DIAGNOSIS — Z23 Encounter for immunization: Secondary | ICD-10-CM | POA: Diagnosis not present

## 2022-09-25 DIAGNOSIS — I10 Essential (primary) hypertension: Secondary | ICD-10-CM | POA: Diagnosis not present

## 2022-09-25 DIAGNOSIS — Z0000 Encounter for general adult medical examination without abnormal findings: Secondary | ICD-10-CM | POA: Diagnosis not present

## 2022-09-25 DIAGNOSIS — Z1339 Encounter for screening examination for other mental health and behavioral disorders: Secondary | ICD-10-CM | POA: Diagnosis not present

## 2022-12-07 DIAGNOSIS — L723 Sebaceous cyst: Secondary | ICD-10-CM | POA: Diagnosis not present

## 2023-03-08 DIAGNOSIS — Z1231 Encounter for screening mammogram for malignant neoplasm of breast: Secondary | ICD-10-CM | POA: Diagnosis not present

## 2023-03-12 DIAGNOSIS — E119 Type 2 diabetes mellitus without complications: Secondary | ICD-10-CM | POA: Diagnosis not present

## 2023-03-12 DIAGNOSIS — H524 Presbyopia: Secondary | ICD-10-CM | POA: Diagnosis not present

## 2023-03-29 DIAGNOSIS — E119 Type 2 diabetes mellitus without complications: Secondary | ICD-10-CM | POA: Diagnosis not present

## 2023-03-29 DIAGNOSIS — E782 Mixed hyperlipidemia: Secondary | ICD-10-CM | POA: Diagnosis not present

## 2023-03-29 DIAGNOSIS — Z23 Encounter for immunization: Secondary | ICD-10-CM | POA: Diagnosis not present

## 2023-03-29 DIAGNOSIS — I10 Essential (primary) hypertension: Secondary | ICD-10-CM | POA: Diagnosis not present

## 2023-04-27 IMAGING — CT CT RENAL STONE PROTOCOL
2 of 4 series · 16 of 46 positions shown, 18 images · non-contrast
Comparison: None.

CLINICAL DATA: Flank pain, kidney stone suspected

Patient reports purple/reddish urine.
EXAM:
CT ABDOMEN AND PELVIS WITHOUT CONTRAST
TECHNIQUE: Multidetector CT imaging of the abdomen and pelvis was performed
following the standard protocol without IV contrast.

[Series 2: axial st · axial · 0.83mm/px · z∈[-626,-216]mm · 13 of 94 slices shown, 15 images]
[im 6/94  soft-tissue]
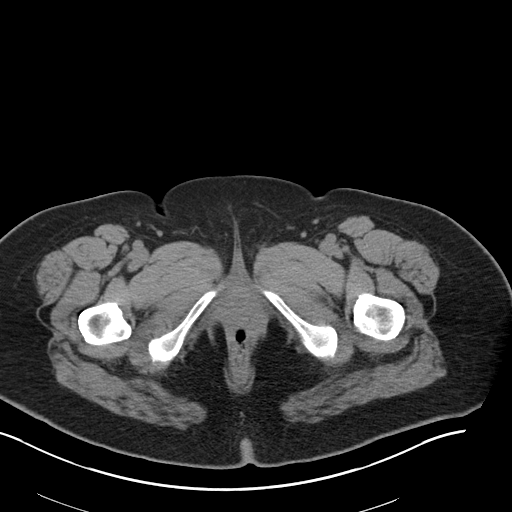
[im 6/94  bone]
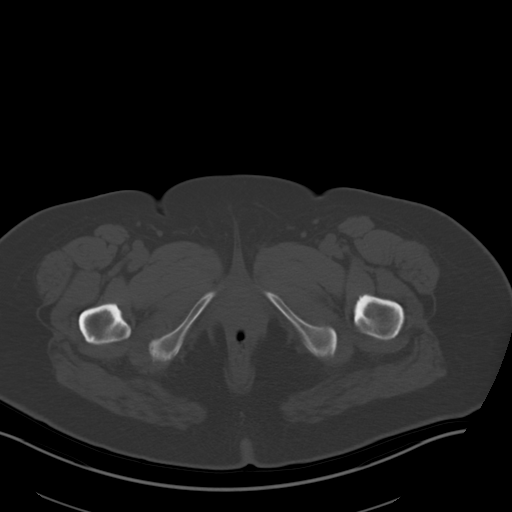
[im 12/94  soft-tissue]
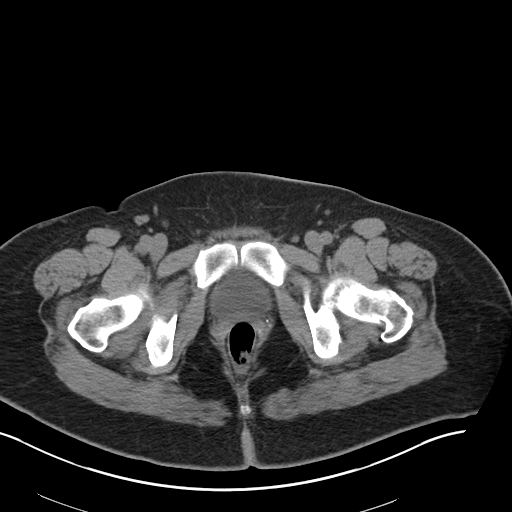
[im 18/94  soft-tissue]
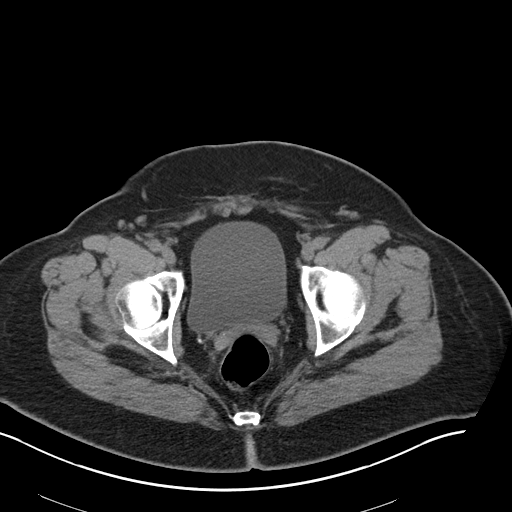
[im 30/94  soft-tissue]
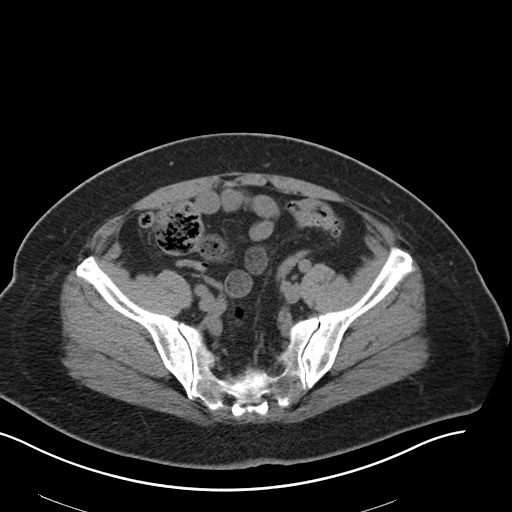
[im 35/94  soft-tissue]
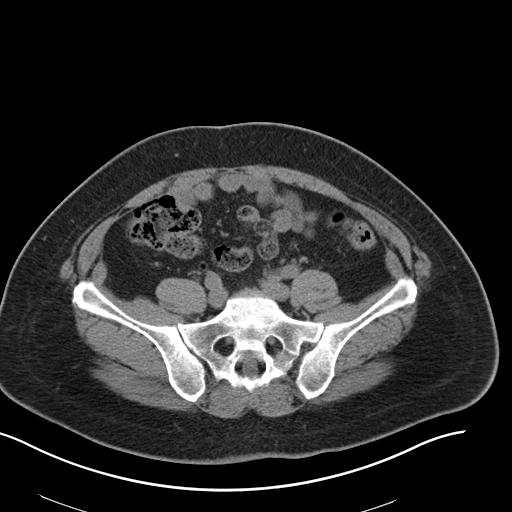
[im 41/94  soft-tissue]
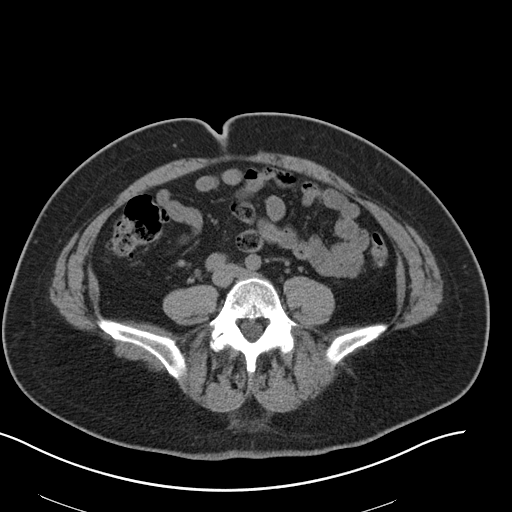
[im 47/94  soft-tissue]
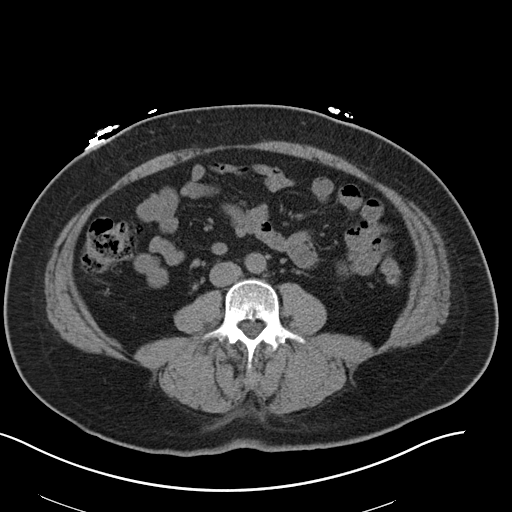
[im 53/94  soft-tissue]
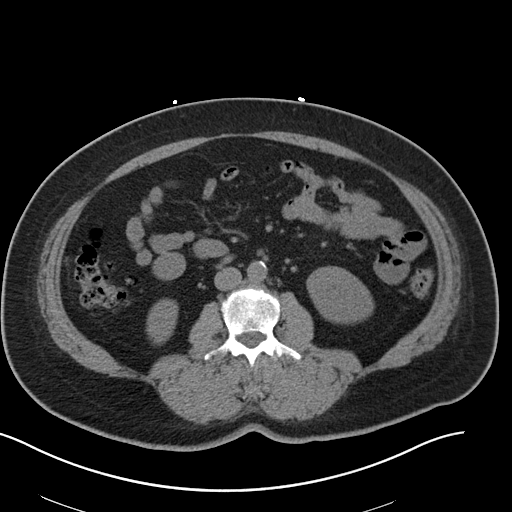
[im 59/94  soft-tissue]
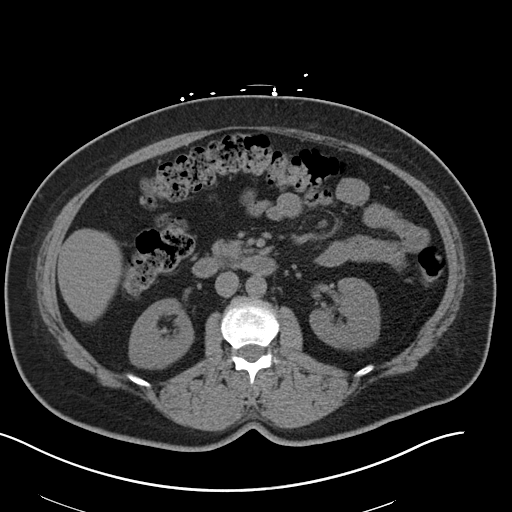
[im 59/94  bone]
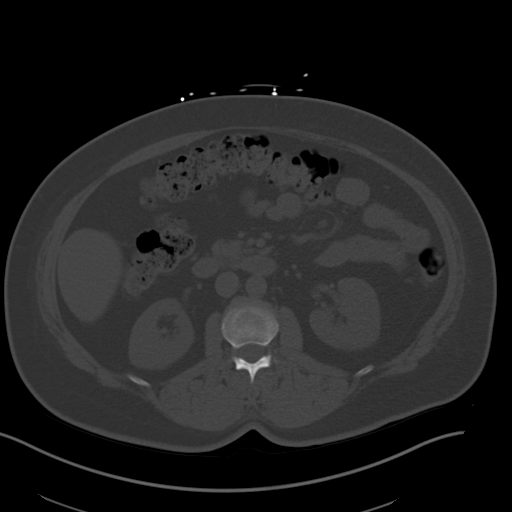
[im 64/94  soft-tissue]
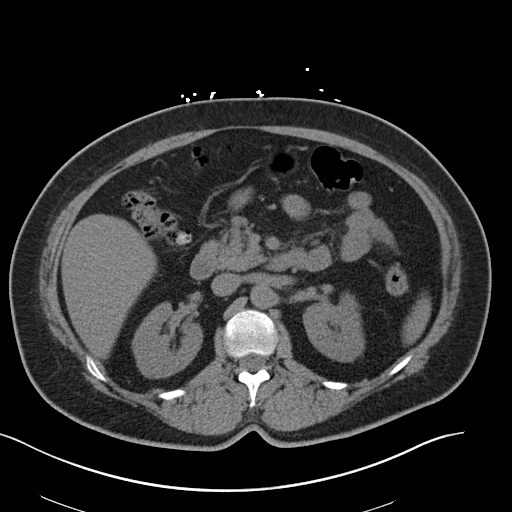
[im 76/94  soft-tissue]
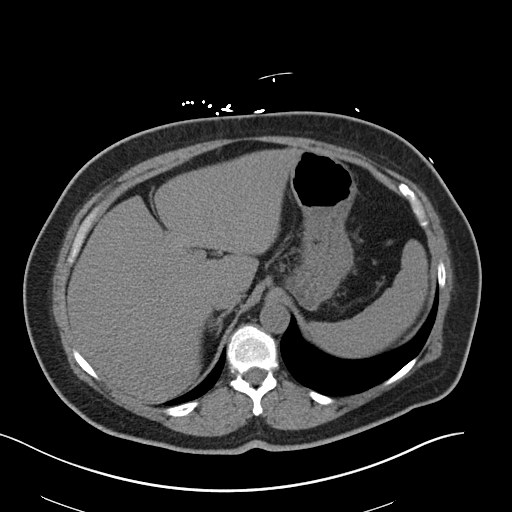
[im 82/94  soft-tissue]
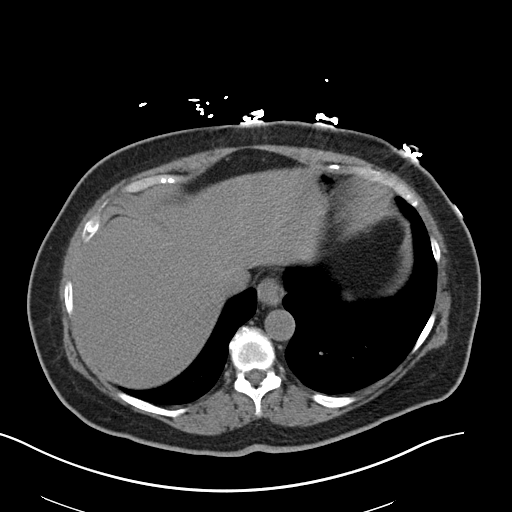
[im 88/94  soft-tissue]
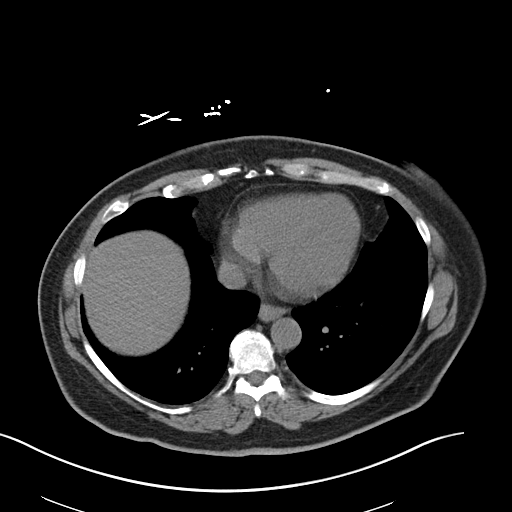

[Series 5: coronal · coronal · 0.82mm/px · 3 of 143 slices shown]
[im 48/143  soft-tissue]
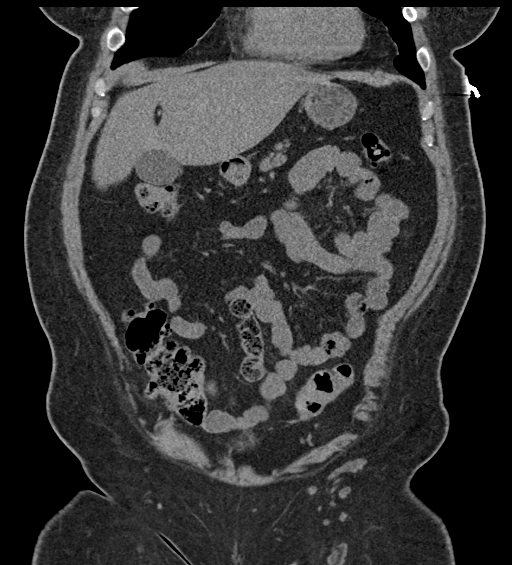
[im 64/143  soft-tissue]
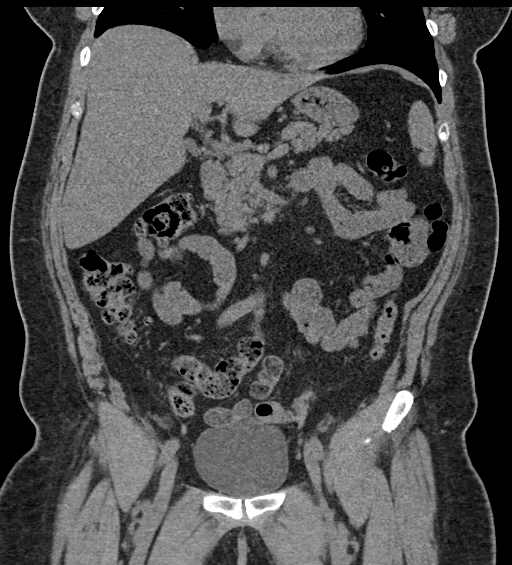
[im 79/143  soft-tissue]
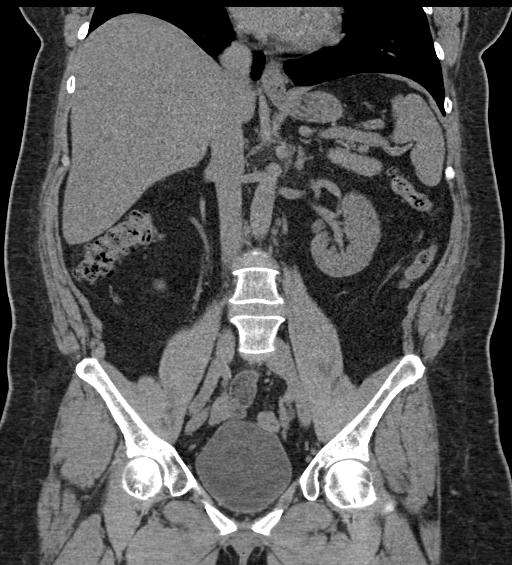

[16 of 46 positions shown; findings below may reference images not displayed]

FINDINGS: Lower chest: No acute airspace disease or pleural effusion the heart
is normal in size.

Hepatobiliary: Mild subjective hepatic steatosis. No focal hepatic
abnormality on this noncontrast exam. Gallbladder physiologically
distended, no calcified stone. No biliary dilatation.

Pancreas: No ductal dilatation or inflammation.

Spleen: Normal in size without focal abnormality. Small splenule
posteriorly.

Adrenals/Urinary Tract: Normal adrenal glands. No hydronephrosis or
renal calculi. No perinephric edema. No evidence of focal renal
abnormality. The ureters are decompressed. No ureteral stone.
Unremarkable urinary bladder. No bladder wall thickening.

Stomach/Bowel: Multifocal colonic diverticulosis, most prominently
affecting the sigmoid colon. No diverticulitis or colonic
inflammation. Normal appendix. Mild fecalization of distal small
bowel contents without bowel obstruction or inflammation.
Unremarkable stomach

Vascular/Lymphatic: Mild aortic atherosclerosis. No aortic aneurysm.
No portal venous or mesenteric gas. Small central mesenteric and
retroperitoneal lymph nodes are not enlarged by size criteria. No
pelvic adenopathy.

Reproductive: Status post hysterectomy. No adnexal masses. Quiescent
ovaries.

Other: No free air, free fluid, or intra-abdominal fluid collection.

Musculoskeletal: Lower lumbar facet hypertrophy. There are no acute
or suspicious osseous abnormalities.
IMPRESSION: 1. No renal stones or obstructive uropathy. No acute abnormality in
the abdomen/pelvis.
2. Colonic diverticulosis without diverticulitis.
3. Mild hepatic steatosis.

Aortic Atherosclerosis ([id number]).

## 2023-09-27 DIAGNOSIS — I10 Essential (primary) hypertension: Secondary | ICD-10-CM | POA: Diagnosis not present

## 2023-09-27 DIAGNOSIS — Z0000 Encounter for general adult medical examination without abnormal findings: Secondary | ICD-10-CM | POA: Diagnosis not present

## 2023-09-27 DIAGNOSIS — R230 Cyanosis: Secondary | ICD-10-CM | POA: Diagnosis not present

## 2023-09-27 DIAGNOSIS — E782 Mixed hyperlipidemia: Secondary | ICD-10-CM | POA: Diagnosis not present

## 2023-10-04 DIAGNOSIS — Z0000 Encounter for general adult medical examination without abnormal findings: Secondary | ICD-10-CM | POA: Diagnosis not present

## 2023-10-04 DIAGNOSIS — I10 Essential (primary) hypertension: Secondary | ICD-10-CM | POA: Diagnosis not present

## 2023-10-04 DIAGNOSIS — Z1339 Encounter for screening examination for other mental health and behavioral disorders: Secondary | ICD-10-CM | POA: Diagnosis not present

## 2023-10-04 DIAGNOSIS — R82998 Other abnormal findings in urine: Secondary | ICD-10-CM | POA: Diagnosis not present

## 2023-10-04 DIAGNOSIS — Z1331 Encounter for screening for depression: Secondary | ICD-10-CM | POA: Diagnosis not present

## 2023-10-04 DIAGNOSIS — E119 Type 2 diabetes mellitus without complications: Secondary | ICD-10-CM | POA: Diagnosis not present

## 2023-11-23 DIAGNOSIS — N3001 Acute cystitis with hematuria: Secondary | ICD-10-CM | POA: Diagnosis not present

## 2024-03-17 DIAGNOSIS — H35443 Age-related reticular degeneration of retina, bilateral: Secondary | ICD-10-CM | POA: Diagnosis not present

## 2024-03-17 DIAGNOSIS — H524 Presbyopia: Secondary | ICD-10-CM | POA: Diagnosis not present

## 2024-04-14 DIAGNOSIS — Z780 Asymptomatic menopausal state: Secondary | ICD-10-CM | POA: Diagnosis not present

## 2024-06-05 DIAGNOSIS — Z1231 Encounter for screening mammogram for malignant neoplasm of breast: Secondary | ICD-10-CM | POA: Diagnosis not present
